# Patient Record
Sex: Female | Born: 1980 | Race: White | Hispanic: No | Marital: Married | Smoking: Former smoker
Health system: Southern US, Community
[De-identification: ages and names within clinical notes are randomized; demographics above are authoritative.]

## PROBLEM LIST (undated history)

## (undated) DIAGNOSIS — R911 Solitary pulmonary nodule: Secondary | ICD-10-CM

## (undated) DIAGNOSIS — Z87442 Personal history of urinary calculi: Secondary | ICD-10-CM

## (undated) DIAGNOSIS — K219 Gastro-esophageal reflux disease without esophagitis: Secondary | ICD-10-CM

## (undated) DIAGNOSIS — Z8249 Family history of ischemic heart disease and other diseases of the circulatory system: Secondary | ICD-10-CM

## (undated) DIAGNOSIS — E785 Hyperlipidemia, unspecified: Secondary | ICD-10-CM

## (undated) DIAGNOSIS — R079 Chest pain, unspecified: Secondary | ICD-10-CM

## (undated) HISTORY — DX: Solitary pulmonary nodule: R91.1

## (undated) HISTORY — DX: Hyperlipidemia, unspecified: E78.5

---
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
## 1999-09-24 ENCOUNTER — Other Ambulatory Visit: Admission: RE | Admit: 1999-09-24 | Discharge: 1999-09-24 | Payer: Self-pay | Admitting: Obstetrics and Gynecology

## 2002-11-30 ENCOUNTER — Encounter: Payer: Self-pay | Admitting: Family Medicine

## 2002-11-30 ENCOUNTER — Encounter: Admission: RE | Admit: 2002-11-30 | Discharge: 2002-11-30 | Payer: Self-pay | Admitting: Family Medicine

## 2005-06-03 ENCOUNTER — Other Ambulatory Visit: Admission: RE | Admit: 2005-06-03 | Discharge: 2005-06-03 | Payer: Self-pay | Admitting: Obstetrics and Gynecology

## 2007-01-12 ENCOUNTER — Emergency Department (HOSPITAL_COMMUNITY): Admission: EM | Admit: 2007-01-12 | Discharge: 2007-01-12 | Payer: Self-pay | Admitting: Emergency Medicine

## 2007-05-05 HISTORY — PX: RHINOPLASTY: SUR1284

## 2007-06-17 ENCOUNTER — Emergency Department (HOSPITAL_COMMUNITY): Admission: EM | Admit: 2007-06-17 | Discharge: 2007-06-17 | Payer: Self-pay | Admitting: Emergency Medicine

## 2007-08-16 ENCOUNTER — Ambulatory Visit (HOSPITAL_BASED_OUTPATIENT_CLINIC_OR_DEPARTMENT_OTHER): Admission: RE | Admit: 2007-08-16 | Discharge: 2007-08-16 | Payer: Self-pay | Admitting: Otolaryngology

## 2009-08-14 ENCOUNTER — Inpatient Hospital Stay (HOSPITAL_COMMUNITY): Admission: AD | Admit: 2009-08-14 | Discharge: 2009-08-14 | Payer: Self-pay | Admitting: Obstetrics and Gynecology

## 2009-10-20 ENCOUNTER — Inpatient Hospital Stay (HOSPITAL_COMMUNITY): Admission: AD | Admit: 2009-10-20 | Discharge: 2009-11-07 | Payer: Self-pay | Admitting: Obstetrics and Gynecology

## 2009-10-21 ENCOUNTER — Encounter: Payer: Self-pay | Admitting: Obstetrics & Gynecology

## 2009-10-24 ENCOUNTER — Encounter: Payer: Self-pay | Admitting: Obstetrics & Gynecology

## 2009-10-25 ENCOUNTER — Encounter: Payer: Self-pay | Admitting: Obstetrics & Gynecology

## 2009-10-28 ENCOUNTER — Encounter: Payer: Self-pay | Admitting: Obstetrics & Gynecology

## 2009-10-29 ENCOUNTER — Encounter: Payer: Self-pay | Admitting: Obstetrics & Gynecology

## 2009-10-30 ENCOUNTER — Encounter: Payer: Self-pay | Admitting: Obstetrics & Gynecology

## 2009-10-31 ENCOUNTER — Encounter: Payer: Self-pay | Admitting: Obstetrics & Gynecology

## 2009-11-01 ENCOUNTER — Encounter: Payer: Self-pay | Admitting: Obstetrics & Gynecology

## 2009-11-04 ENCOUNTER — Encounter (INDEPENDENT_AMBULATORY_CARE_PROVIDER_SITE_OTHER): Payer: Self-pay | Admitting: Obstetrics & Gynecology

## 2009-11-07 ENCOUNTER — Inpatient Hospital Stay (HOSPITAL_COMMUNITY): Admission: AD | Admit: 2009-11-07 | Discharge: 2009-11-07 | Payer: Self-pay | Admitting: Obstetrics and Gynecology

## 2009-11-07 ENCOUNTER — Encounter: Admission: RE | Admit: 2009-11-07 | Discharge: 2009-12-07 | Payer: Self-pay | Admitting: Obstetrics and Gynecology

## 2009-11-07 ENCOUNTER — Ambulatory Visit: Payer: Self-pay | Admitting: Obstetrics and Gynecology

## 2009-11-07 DIAGNOSIS — O909 Complication of the puerperium, unspecified: Secondary | ICD-10-CM

## 2009-12-08 ENCOUNTER — Encounter: Admission: RE | Admit: 2009-12-08 | Discharge: 2010-01-07 | Payer: Self-pay | Admitting: Obstetrics and Gynecology

## 2009-12-10 ENCOUNTER — Inpatient Hospital Stay (HOSPITAL_COMMUNITY): Admission: AD | Admit: 2009-12-10 | Discharge: 2009-12-10 | Payer: Self-pay | Admitting: Obstetrics and Gynecology

## 2009-12-10 ENCOUNTER — Ambulatory Visit: Payer: Self-pay | Admitting: Gynecology

## 2009-12-10 DIAGNOSIS — R0602 Shortness of breath: Secondary | ICD-10-CM

## 2009-12-10 DIAGNOSIS — R252 Cramp and spasm: Secondary | ICD-10-CM

## 2009-12-10 DIAGNOSIS — O99893 Other specified diseases and conditions complicating puerperium: Secondary | ICD-10-CM

## 2009-12-10 DIAGNOSIS — O9989 Other specified diseases and conditions complicating pregnancy, childbirth and the puerperium: Secondary | ICD-10-CM

## 2009-12-18 ENCOUNTER — Encounter: Payer: Self-pay | Admitting: Family Medicine

## 2010-01-08 ENCOUNTER — Encounter: Admission: RE | Admit: 2010-01-08 | Discharge: 2010-01-21 | Payer: Self-pay | Admitting: Obstetrics and Gynecology

## 2010-02-08 ENCOUNTER — Encounter
Admission: RE | Admit: 2010-02-08 | Discharge: 2010-03-10 | Payer: Self-pay | Source: Home / Self Care | Admitting: Obstetrics and Gynecology

## 2010-03-11 ENCOUNTER — Encounter
Admission: RE | Admit: 2010-03-11 | Discharge: 2010-04-10 | Payer: Self-pay | Source: Home / Self Care | Attending: Obstetrics and Gynecology | Admitting: Obstetrics and Gynecology

## 2010-04-11 ENCOUNTER — Encounter
Admission: RE | Admit: 2010-04-11 | Discharge: 2010-05-11 | Payer: Self-pay | Source: Home / Self Care | Attending: Obstetrics and Gynecology | Admitting: Obstetrics and Gynecology

## 2010-05-06 ENCOUNTER — Other Ambulatory Visit (HOSPITAL_COMMUNITY): Payer: Self-pay | Admitting: Obstetrics and Gynecology

## 2010-05-06 ENCOUNTER — Ambulatory Visit
Admission: RE | Admit: 2010-05-06 | Discharge: 2010-05-06 | Payer: Self-pay | Source: Home / Self Care | Attending: Obstetrics and Gynecology | Admitting: Obstetrics and Gynecology

## 2010-05-06 ENCOUNTER — Encounter: Payer: Self-pay | Admitting: Family Medicine

## 2010-05-09 ENCOUNTER — Encounter: Payer: Self-pay | Admitting: Family Medicine

## 2010-05-09 ENCOUNTER — Ambulatory Visit (HOSPITAL_COMMUNITY)
Admission: RE | Admit: 2010-05-09 | Discharge: 2010-05-09 | Payer: Self-pay | Source: Home / Self Care | Attending: Obstetrics and Gynecology | Admitting: Obstetrics and Gynecology

## 2010-05-12 ENCOUNTER — Encounter
Admission: RE | Admit: 2010-05-12 | Discharge: 2010-05-20 | Payer: Self-pay | Source: Home / Self Care | Attending: Obstetrics and Gynecology | Admitting: Obstetrics and Gynecology

## 2010-05-16 ENCOUNTER — Other Ambulatory Visit: Payer: Self-pay | Admitting: Family Medicine

## 2010-05-16 ENCOUNTER — Encounter: Payer: Self-pay | Admitting: Family Medicine

## 2010-05-16 ENCOUNTER — Observation Stay (HOSPITAL_COMMUNITY)
Admission: EM | Admit: 2010-05-16 | Discharge: 2010-05-17 | Payer: Self-pay | Source: Home / Self Care | Attending: Internal Medicine | Admitting: Internal Medicine

## 2010-05-16 ENCOUNTER — Ambulatory Visit
Admission: RE | Admit: 2010-05-16 | Discharge: 2010-05-16 | Payer: Self-pay | Source: Home / Self Care | Attending: Family Medicine | Admitting: Family Medicine

## 2010-05-16 LAB — CBC WITH DIFFERENTIAL/PLATELET
Basophils Absolute: 0.1 10*3/uL (ref 0.0–0.1)
Basophils Relative: 0.7 % (ref 0.0–3.0)
Eosinophils Absolute: 0.3 10*3/uL (ref 0.0–0.7)
Eosinophils Relative: 3.6 % (ref 0.0–5.0)
HCT: 45.1 % (ref 36.0–46.0)
Hemoglobin: 15.6 g/dL — ABNORMAL HIGH (ref 12.0–15.0)
Lymphocytes Relative: 27.9 % (ref 12.0–46.0)
Lymphs Abs: 2.2 10*3/uL (ref 0.7–4.0)
MCHC: 34.6 g/dL (ref 30.0–36.0)
MCV: 97.6 fl (ref 78.0–100.0)
Monocytes Absolute: 0.4 10*3/uL (ref 0.1–1.0)
Monocytes Relative: 5.1 % (ref 3.0–12.0)
Neutro Abs: 4.9 10*3/uL (ref 1.4–7.7)
Neutrophils Relative %: 62.7 % (ref 43.0–77.0)
Platelets: 296 10*3/uL (ref 150.0–400.0)
RBC: 4.62 Mil/uL (ref 3.87–5.11)
RDW: 12.3 % (ref 11.5–14.6)
WBC: 7.9 10*3/uL (ref 4.5–10.5)

## 2010-05-16 LAB — HEPATIC FUNCTION PANEL
ALT: 12 U/L (ref 0–35)
AST: 14 U/L (ref 0–37)
Albumin: 4.6 g/dL (ref 3.5–5.2)
Alkaline Phosphatase: 55 U/L (ref 39–117)
Bilirubin, Direct: 0.2 mg/dL (ref 0.0–0.3)
Total Bilirubin: 1.8 mg/dL — ABNORMAL HIGH (ref 0.3–1.2)
Total Protein: 7.3 g/dL (ref 6.0–8.3)

## 2010-05-16 LAB — TSH: TSH: 1.57 u[IU]/mL (ref 0.35–5.50)

## 2010-05-16 LAB — BASIC METABOLIC PANEL
BUN: 14 mg/dL (ref 6–23)
CO2: 25 mEq/L (ref 19–32)
Calcium: 9.6 mg/dL (ref 8.4–10.5)
Chloride: 104 mEq/L (ref 96–112)
Creatinine, Ser: 0.8 mg/dL (ref 0.4–1.2)
GFR: 92.04 mL/min (ref >60.00)
Glucose, Bld: 87 mg/dL (ref 70–99)
Potassium: 4.5 mEq/L (ref 3.5–5.1)
Sodium: 138 mEq/L (ref 135–145)

## 2010-05-16 LAB — CARDIAC PANEL
CK-MB: 1.1 ng/mL (ref 0.3–4.0)
Relative Index: 1.5 calc (ref 0.0–2.5)
Total CK: 74 U/L (ref 7–177)

## 2010-05-17 ENCOUNTER — Encounter (INDEPENDENT_AMBULATORY_CARE_PROVIDER_SITE_OTHER): Payer: Self-pay | Admitting: Internal Medicine

## 2010-05-17 ENCOUNTER — Encounter: Payer: Self-pay | Admitting: Family Medicine

## 2010-05-19 LAB — CBC
HCT: 42.5 % (ref 36.0–46.0)
Hemoglobin: 14.2 g/dL (ref 12.0–15.0)
MCH: 32 pg (ref 26.0–34.0)
MCHC: 33.4 g/dL (ref 30.0–36.0)
MCV: 95.7 fL (ref 78.0–100.0)
Platelets: 282 10*3/uL (ref 150–400)
RBC: 4.44 MIL/uL (ref 3.87–5.11)
RDW: 12 % (ref 11.5–15.5)
WBC: 9.4 10*3/uL (ref 4.0–10.5)

## 2010-05-19 LAB — LIPID PANEL
Cholesterol: 210 mg/dL — ABNORMAL HIGH (ref 0–200)
HDL: 58 mg/dL (ref >39)
LDL Cholesterol: 142 mg/dL — ABNORMAL HIGH (ref 0–99)
Total CHOL/HDL Ratio: 3.6 RATIO
Triglycerides: 50 mg/dL (ref <150)
VLDL: 10 mg/dL (ref 0–40)

## 2010-05-19 LAB — CARDIAC PANEL(CRET KIN+CKTOT+MB+TROPI)
CK, MB: 0.8 ng/mL (ref 0.3–4.0)
CK, MB: 0.8 ng/mL (ref 0.3–4.0)
Relative Index: INVALID (ref 0.0–2.5)
Relative Index: INVALID (ref 0.0–2.5)
Total CK: 50 U/L (ref 7–177)
Total CK: 43 U/L (ref 7–177)
Troponin I: <0.01 ng/mL (ref 0.00–0.06)
Troponin I: <0.01 ng/mL (ref 0.00–0.06)

## 2010-05-19 LAB — POCT CARDIAC MARKERS
CKMB, poc: <1 ng/mL — ABNORMAL LOW (ref 1.0–8.0)
Myoglobin, poc: 32.3 ng/mL (ref 12–200)
Troponin i, poc: <0.05 ng/mL (ref 0.00–0.09)

## 2010-05-19 LAB — BASIC METABOLIC PANEL
BUN: 13 mg/dL (ref 6–23)
CO2: 22 mEq/L (ref 19–32)
Calcium: 8.9 mg/dL (ref 8.4–10.5)
Chloride: 103 mEq/L (ref 96–112)
Creatinine, Ser: 0.91 mg/dL (ref 0.4–1.2)
GFR calc Af Amer: >60 mL/min (ref >60)
GFR calc non Af Amer: >60 mL/min (ref >60)
Glucose, Bld: 93 mg/dL (ref 70–99)
Potassium: 3.6 mEq/L (ref 3.5–5.1)
Sodium: 133 mEq/L — ABNORMAL LOW (ref 135–145)

## 2010-05-19 LAB — DIFFERENTIAL
Basophils Absolute: 0.1 10*3/uL (ref 0.0–0.1)
Basophils Relative: 1 % (ref 0–1)
Eosinophils Absolute: 0.5 10*3/uL (ref 0.0–0.7)
Eosinophils Relative: 5 % (ref 0–5)
Lymphocytes Relative: 31 % (ref 12–46)
Lymphs Abs: 2.9 10*3/uL (ref 0.7–4.0)
Monocytes Absolute: 0.6 10*3/uL (ref 0.1–1.0)
Monocytes Relative: 6 % (ref 3–12)
Neutro Abs: 5.4 10*3/uL (ref 1.7–7.7)
Neutrophils Relative %: 57 % (ref 43–77)

## 2010-05-19 LAB — POCT PREGNANCY, URINE: Preg Test, Ur: NEGATIVE

## 2010-05-19 LAB — TSH: TSH: 2.948 u[IU]/mL (ref 0.350–4.500)

## 2010-05-19 LAB — CK TOTAL AND CKMB (NOT AT ARMC)
CK, MB: 0.9 ng/mL (ref 0.3–4.0)
Relative Index: INVALID (ref 0.0–2.5)
Total CK: 59 U/L (ref 7–177)

## 2010-05-19 LAB — TROPONIN I: Troponin I: 0.01 ng/mL (ref 0.00–0.06)

## 2010-05-19 LAB — D-DIMER, QUANTITATIVE: D-Dimer, Quant: 0.37 ug/mL-FEU (ref 0.00–0.48)

## 2010-05-21 ENCOUNTER — Encounter: Payer: Self-pay | Admitting: Cardiovascular Disease

## 2010-05-21 ENCOUNTER — Ambulatory Visit
Admission: RE | Admit: 2010-05-21 | Discharge: 2010-05-21 | Payer: Self-pay | Source: Home / Self Care | Attending: Cardiovascular Disease | Admitting: Cardiovascular Disease

## 2010-05-22 ENCOUNTER — Ambulatory Visit
Admission: RE | Admit: 2010-05-22 | Discharge: 2010-05-22 | Payer: Self-pay | Source: Home / Self Care | Attending: Cardiovascular Disease | Admitting: Cardiovascular Disease

## 2010-05-22 ENCOUNTER — Ambulatory Visit: Admission: RE | Admit: 2010-05-22 | Discharge: 2010-05-22 | Payer: Self-pay | Source: Home / Self Care

## 2010-05-22 ENCOUNTER — Encounter: Payer: Self-pay | Admitting: Cardiovascular Disease

## 2010-05-25 ENCOUNTER — Encounter: Payer: Self-pay | Admitting: Obstetrics & Gynecology

## 2010-05-26 NOTE — H&P (Signed)
Carrie Morrison, ROAN              ACCOUNT NO.:  [id number]  MEDICAL RECORD NO.:  [id number]          PATIENT TYPE:  EMS  LOCATION:  [facility]                         FACILITY:  [facility]  PHYSICIAN:  Houston Siren, MD           DATE OF BIRTH:  18-May-1980  DATE OF ADMISSION:  05/16/2010 DATE OF DISCHARGE:                             HISTORY & PHYSICAL   PRIMARY CARE PHYSICIAN:  Lelon Perla, DO  ADVANCE DIRECTIVES:  Full code.  REASON FOR ADMISSION:  Chest pain.  HISTORY OF PRESENT ILLNESS:  This is a 30 year old female, previously healthy, on no chronic medication, presents to the emergency room with intermittent substernal chest pain  with left arm radiation and some nausea and diaphoresis for the past couple of days.  She usually had no exertional chest pain and has been healthy, and prior to the birth of her first child, she was exercising regularly.  She never had any exertional chest pain, but noted that she has some dyspnea on exertion.  There is no audible wheezing or cough with exercise.  She denied any abdominal cramps or pain, black stool, or bloody stool.  She has no fever, chills, or cough.  She has had no calf swelling or calf pain.  She denied long car or plane ride.  In the emergency room, evaluation showed negative cardiac enzymes, negative EKG, and negative D-dimer.  She had a CT scan which showed a small right lower lobe nodule, but the current chest x-ray did not show this 5-mm nodule seen on previous CT.  She does have strong family history of coronary artery disease which includes her father and mother both had coronary artery disease in their 43s and 82s.  She was a prior smoker, but currently she is not, and she does not know her lipid profile.  PAST MEDICAL HISTORY:  As above.  REVIEW OF SYSTEMS:  Otherwise unremarkable.  FAMILY HISTORY:  Both parents have premature coronary artery disease.  SOCIAL HISTORY:  She was a formal smoker but denied any  current cigarette use nor drug use.  She does have a baby.  She works in Audiological scientist.  Her husband works in Airline pilot.  ALLERGIES:  LATEX which causes a rash.  MEDICATIONS:  She took an aspirin today but otherwise on no chronic medications.  PHYSICAL EXAMINATION:  VITAL SIGNS:  Blood pressure 100/65, pulse 77, respiratory rate 18, and temperature 97.9. GENERAL:  She is alert and oriented and is in no apparent distress. NECK:  She has no carotid bruit. HEENT:  Throat is clear.  Speech is fluent.  She has facial symmetry. CARDIAC:  S1-S2 regular.  I did not hear any murmur or any click. LUNGS:  Clear with no wheezes, rales, or any evidence of consolidation. ABDOMEN:  Soft, nondistended, and nontender.  No right upper quadrant pain. CHEST:  Palpation of her chest wall reveals slight tenderness but not significant. EXTREMITIES:  No edema.  Good pulses bilaterally. SKIN:  Warm and dry.  She has no calf tenderness.  OBJECTIVE FINDINGS:  Chest x-ray clear.  D-dimer of 0.37.  Troponin less than 0.05.  Potassium 3.6 and creatinine 0.91.  White count of 9.4 thousand, and hemoglobin of 14.2.  EKG shows normal sinus rhythm without any acute ST-T changes, normal interval.  Axis is at 60 degrees.  IMPRESSION:  This is a 30 year old female with low cardiac risk factors presented with atypical chest pain.  She does have strong family history of coronary artery disease, and she does not know her lipid profile.  I do not think she has acute coronary syndrome, but we will admit her to Telemetry for rule out with serial CPKs and troponins.  She should proceed with stress test and echo, although I suspect this will be a low yield.  I would like to check her lipid profile, continue aspirin, and add 2 grams of fish oil to her regimen. She is 6 months postpartum, and I will check a TSH as well.  I did speak with her and her husband about acute coronary syndrome and risk modifications.  She is stable, full  code, and will be admitted to the [facility].     Houston Siren, MD     PL/MEDQ  D:  05/17/2010  T:  05/17/2010  Job:  [id number]  Electronically Signed by Houston Siren  on 05/26/2010 09:10:56 PM

## 2010-05-30 ENCOUNTER — Encounter: Payer: Self-pay | Admitting: Family Medicine

## 2010-05-30 ENCOUNTER — Ambulatory Visit
Admission: RE | Admit: 2010-05-30 | Discharge: 2010-05-30 | Payer: Self-pay | Source: Home / Self Care | Attending: Family Medicine | Admitting: Family Medicine

## 2010-05-30 ENCOUNTER — Ambulatory Visit
Admission: RE | Admit: 2010-05-30 | Discharge: 2010-05-30 | Payer: Self-pay | Source: Home / Self Care | Attending: Internal Medicine | Admitting: Internal Medicine

## 2010-05-30 ENCOUNTER — Other Ambulatory Visit: Payer: Self-pay | Admitting: Internal Medicine

## 2010-05-30 DIAGNOSIS — R911 Solitary pulmonary nodule: Secondary | ICD-10-CM

## 2010-05-30 LAB — CONVERTED CEMR LAB
Glucose, Urine, Semiquant: NEGATIVE
Nitrite: NEGATIVE
WBC Urine, dipstick: NEGATIVE
pH: 5

## 2010-05-31 ENCOUNTER — Encounter: Payer: Self-pay | Admitting: Family Medicine

## 2010-06-03 ENCOUNTER — Telehealth: Payer: Self-pay | Admitting: Internal Medicine

## 2010-06-03 LAB — CONVERTED CEMR LAB
Bacteria, UA: NONE SEEN
Casts: NONE SEEN /lpf
Crystals: NONE SEEN
Ketones, ur: NEGATIVE mg/dL
Leukocytes, UA: NEGATIVE
Nitrite: NEGATIVE
Specific Gravity, Urine: 1.024 (ref 1.005–1.030)
Squamous Epithelial / LPF: NONE SEEN /lpf
Urobilinogen, UA: 0.2 (ref 0.0–1.0)
pH: 5 (ref 5.0–8.0)

## 2010-06-05 ENCOUNTER — Encounter (HOSPITAL_COMMUNITY): Payer: Self-pay

## 2010-06-05 NOTE — Letter (Signed)
Summary: Encounter Notice/[facility]  Encounter Notice/[facility]   Imported By: Lanelle Bal 05/30/2010 10:28:24  _____________________________________________________________________  External Attachment:    Type:   Image     Comment:   External Document

## 2010-06-05 NOTE — Assessment & Plan Note (Signed)
Summary: new pt--chest pain, SOB, pulm nodule on ct scan--appt set up by   Vital Signs:  Patient profile:   30 year old female Menstrual status:  regular LMP:     04/17/2010 Height:      64 inches Weight:      147.2 pounds BMI:     25.36 Pulse rate:   68 / minute Pulse rhythm:   regular BP sitting:   110 / 62  (right arm) Cuff size:   regular  Vitals Entered By: Almeta Monas CMA Duncan Dull) (May 16, 2010 10:32 AM) CC: New Est--wants to discuss CT from [facility] LMP (date): 04/17/2010     Menstrual Status regular Enter LMP: 04/17/2010   History of Present Illness: Pt is here to establish.  Pt recently had baby in July and was having chest pain.  OB/Gyn sent her for dopplers and CT chest.   She was told she had a nodule on her lung.  She used to smoke---quit smoking January 2011.   Pt was smoking  about 1 pack a day for at least 10 years.  Pt is having shooting pains in L chest that go down L arm.  She also has pain across R low ribs.   Pt is getting chest pain daily.  yesterday she felt it while walking.  When she rested it went.    Preventive Screening-Counseling & Management  Alcohol-Tobacco     Alcohol drinks/day: <1     Alcohol type: wine     Smoking Status: quit     Smoke Cessation Stage: quit     Packs/Day: 1.0     Year Started: 2002     Year Quit: 2011     Passive Smoke Exposure: yes  Caffeine-Diet-Exercise     Caffeine use/day: 1     Does Patient Exercise: yes     Type of exercise: walking     Times/week: 7      Sexual History:  currently monogamous.        Drug Use:  no.    Current Medications (verified): 1)  None  Allergies (verified): 1)  ! Latex Exam Gloves (Disposable Gloves)  Past History:  Family History: Last updated: 05/16/2010 Family History High cholesterol Family History Hypertension Family History of Sudden Death Family History of Cardiovascular disorder Family History of Colon CA 1st degree relative <60 Family History Diabetes 1st  degree relative Family History of Stroke F 1st degree relative <60 Family History of CAD Female 1st degree relative <50 -- 31yo Family History of CAD Female 1st degree relative <60    Social History: Last updated: 05/16/2010 Married Former Smoker Alcohol use-yes Drug use-no Regular exercise-yes  Risk Factors: Alcohol Use: <1 (05/16/2010) Caffeine Use: 1 (05/16/2010) Exercise: yes (05/16/2010)  Risk Factors: Smoking Status: quit (05/16/2010) Packs/Day: 1.0 (05/16/2010) Passive Smoke Exposure: yes (05/16/2010)  Past Medical History: Kidney stones  Migraines UTI Headache  Past Surgical History: Caesarean Section 11/04/09 Rhinoplasty 2009  Family History: Reviewed history and no changes required. Family History High cholesterol Family History Hypertension Family History of Sudden Death Family History of Cardiovascular disorder Family History of Colon CA 1st degree relative <60 Family History Diabetes 1st degree relative Family History of Stroke F 1st degree relative <60 Family History of CAD Female 1st degree relative <50 -- 31yo Family History of CAD Female 1st degree relative <60    Social History: Reviewed history and no changes required. Married Former Smoker Alcohol use-yes Drug use-no Regular exercise-yes Smoking Status:  quit Drug  Use:  no Does Patient Exercise:  yes Packs/Day:  1.0 Passive Smoke Exposure:  yes Caffeine use/day:  1 Sexual History:  currently monogamous                       Review of Systems      See HPI  Physical Exam  General:  Well-developed,well-nourished,in no acute distress; alert,appropriate and cooperative throughout examination Neck:  No deformities, masses, or tenderness noted. Lungs:  Normal respiratory effort, chest expands symmetrically. Lungs are clear to auscultation, no crackles or wheezes. Heart:  normal rate and no murmur.   Extremities:  No clubbing, cyanosis, edema, or deformity noted with normal full range of  motion of all joints.   Skin:  Intact without suspicious lesions or rashes Psych:  Oriented X3, normally interactive, good eye contact, not anxious appearing, and not depressed appearing.      Impression & Recommendations:  Problem # 1:  CHEST PAIN UNSPECIFIED (ICD-786.50) Refer to cardiology secondary to family history and ongoing symptoms CT neg PE but there was a pulm nodule  Orders: Cardiology Referral (Cardiology) Venipuncture [phone]) TLB-Cardiac Panel (56213_08657-QION) TLB-BMP (Basic Metabolic Panel-BMET) (80048-METABOL) TLB-CBC Platelet - w/Differential (85025-CBCD) TLB-Hepatic/Liver Function Pnl (80076-HEPATIC) TLB-TSH (Thyroid Stimulating Hormone) (84443-TSH) T- * Misc. Laboratory test ([phone]) Specimen Handling (84132) EKG w/ Interpretation (93000)  Problem # 2:  PULMONARY NODULE, RIGHT LOWER LOBE (ICD-518.89)  Orders: Pulmonary Referral (Pulmonary) EKG w/ Interpretation (93000)   Orders Added: 1)  Cardiology Referral [Cardiology] 2)  Venipuncture [44010] 3)  TLB-Cardiac Panel [82550_82553-CARD] 4)  TLB-BMP (Basic Metabolic Panel-BMET) [80048-METABOL] 5)  TLB-CBC Platelet - w/Differential [85025-CBCD] 6)  TLB-Hepatic/Liver Function Pnl [80076-HEPATIC] 7)  TLB-TSH (Thyroid Stimulating Hormone) [84443-TSH] 8)  T- * Misc. Laboratory test [phone] 9)  Specimen Handling [99000] 10)  Pulmonary Referral [Pulmonary] 11)  New Patient Level III [99203] 12)  EKG w/ Interpretation [93000]   Immunization History:  Tetanus/Td Immunization History:    Tetanus/Td:  historical (07/02/2008)   Immunization History:  Tetanus/Td Immunization History:    Tetanus/Td:  Historical (07/02/2008)

## 2010-06-05 NOTE — Assessment & Plan Note (Signed)
Summary: RASH MOSTLY ALL OVER, SOME AREAS ON LEGS///SPH   Vital Signs:  Patient profile:   30 year old female Menstrual status:  regular Weight:      145.0 pounds Temp:     98.2 degrees F oral BP sitting:   108 / 72  (right arm) Cuff size:   large  Vitals Entered By: Almeta Monas CMA (AAMA) (May 30, 2010 10:00 AM) CC: c/o rash on the arms and legs that itch xfew months, Rash, Dysuria   History of Present Illness:  Rash      This is a 30 year old woman who presents with Rash.  The patient complains of papules, but denies macules, nodules, hives, welts, pustules, blisters, ulcers, itching, scaling, weeping, oozing, redness, increased warmth, and tenderness.  The patient denies the following symptoms: fever, headache, facial swelling, tongue swelling, burning, difficulty breathing, abdominal pain, nausea, vomiting, diarrhea, dizziness, sore throat, dysuria, eye symptoms, arthralgias, and vaginal discharge.  The patient denies history of recent tick bite, recent tick exposure, other insect bite, recent infection, recent antibiotic use, new medication, new clothing, new topical exposure, recent travel, pet/animal contact, thyroid disease, chronic liver disease, autoimmune disease, chronic edema, and prior STD.    Dysuria      The patient also presents with Dysuria.  The patient complains of burning with urination and urinary frequency, but denies urgency, hematuria, vaginal discharge, vaginal itching, and vaginal sores.  The patient denies the following associated symptoms: nausea, vomiting, fever, shaking chills, flank pain, abdominal pain, back pain, pelvic pain, and arthralgias.  The patient denies the following risk factors: diabetes, prior antibiotics, immunosuppression, history of GU anomaly, history of pyelonephritis, pregnancy, history of STD, and analgesic abuse.    Current Medications (verified): 1)  Aspirin 81 Mg Tbec (Aspirin) .... Take One Tablet By Mouth Daily 2)  Westcort  0.2 % Oint (Hydrocortisone Valerate) .... Apply Three Times A Day As Needed 3)  Macrobid 100 Mg Caps (Nitrofurantoin Monohyd Macro) .Marland Kitchen.. 1 By Mouth Two Times A Day  Allergies (verified): 1)  ! Latex Exam Gloves (Disposable Gloves)  Past History:  Past Medical History: Last updated: 05/21/2010 Kidney stones  Migraines Headaches UTI Hyperlipidemia Uterine fibroids  Past Surgical History: Last updated: 05/21/2010 Caesarean Section 11/04/09 Rhinoplasty 2009 Oral surgery 1995  Family History: Last updated: 05/21/2010 Family History High cholesterol Family History Hypertension Family History of Sudden Death Family History of Cardiovascular disorder Family History of Colon CA 1st degree relative <60 Family History Diabetes 1st degree relative Family History of Stroke F 1st degree relative <60 Family History of CAD Female 1st degree relative <50 -- 31yo Family History of CAD Female 1st degree relative <60    Mother alive age 40, CAD with MI at age 15 Father deceased at age 68, CAD starting at age 35, died age 16. Paternal Grandfather with MI at age 22.   Social History: Last updated: 05/21/2010 Married, 1 child Former Smoker-Smoked for 10 years. Quit November 2009 Alcohol use-yes Drug use-no Regular exercise-yes Stays at home with her baby  Risk Factors: Alcohol Use: <1 (05/16/2010) Caffeine Use: 1 (05/16/2010) Exercise: yes (05/16/2010)  Risk Factors: Smoking Status: quit (05/22/2010) Packs/Day: 1.0 (05/16/2010) Passive Smoke Exposure: yes (05/16/2010)  Family History: Reviewed history from 05/21/2010 and no changes required. Family History High cholesterol Family History Hypertension Family History of Sudden Death Family History of Cardiovascular disorder Family History of Colon CA 1st degree relative <60 Family History Diabetes 1st degree relative Family History of Stroke F 1st  degree relative <60 Family History of CAD Female 1st degree relative <50 --  31yo Family History of CAD Female 1st degree relative <60    Mother alive age 70, CAD with MI at age 68 Father deceased at age 44, CAD starting at age 59, died age 37. Paternal Grandfather with MI at age 77.   Social History: Reviewed history from 05/21/2010 and no changes required. Married, 1 child Former Smoker-Smoked for 10 years. Quit November 2009 Alcohol use-yes Drug use-no Regular exercise-yes Stays at home with her baby  Review of Systems      See HPI  Physical Exam  General:  Well-developed,well-nourished,in no acute distress; alert,appropriate and cooperative throughout examination Skin:  papules arms and legs  Psych:  Oriented X3 and normally interactive.     Impression & Recommendations:  Problem # 1:  RASH-NONVESICULAR (ICD-782.1)  Her updated medication list for this problem includes:    Westcort 0.2 % Oint (Hydrocortisone valerate) .Marland Kitchen... Apply three times a day as needed  Discussed medication use and symptom control.   Problem # 2:  DYSURIA (ICD-788.1)  Her updated medication list for this problem includes:    Macrobid 100 Mg Caps (Nitrofurantoin monohyd macro) .Marland Kitchen... 1 by mouth two times a day  Orders: T-Urinalysis (54098-11914) T-Culture, Urine (78295-62130) T-Urine Microscopic (86578-46962)  Encouraged to push clear liquids, get enough rest, and take acetaminophen as needed. To be seen in 10 days if no improvement, sooner if worse.  Complete Medication List: 1)  Aspirin 81 Mg Tbec (Aspirin) .... Take one tablet by mouth daily 2)  Westcort 0.2 % Oint (Hydrocortisone valerate) .... Apply three times a day as needed 3)  Macrobid 100 Mg Caps (Nitrofurantoin monohyd macro) .Marland Kitchen.. 1 by mouth two times a day Prescriptions: MACROBID 100 MG CAPS (NITROFURANTOIN MONOHYD MACRO) 1 by mouth two times a day  #14 x 0   Entered and Authorized by:   Loreen Freud DO   Signed by:   Loreen Freud DO on 05/30/2010   Method used:   Electronically to        [facility] [phone]* (retail)       [address]       [place], [place]  [address]       Ph: [phone] or [phone]       Fax: [phone]   RxID:   [id number] WESTCORT 0.2 % OINT (HYDROCORTISONE VALERATE) apply three times a day as needed  #30g x 2   Entered and Authorized by:   Loreen Freud DO   Signed by:   Loreen Freud DO on 05/30/2010   Method used:   Electronically to        [facility] [phone]* (retail)       [address]       [place], [place]  [address]       Ph: [phone] or [phone]       Fax: [phone]   RxID:   [id number]    Orders Added: 1)  T-Urinalysis [81003-65000] 2)  T-Culture, Urine [50093-81829] 3)  T-Urine Microscopic [93716-96789] 4)  Est. Patient Level III [38101]    Laboratory Results   Urine Tests    Routine Urinalysis   Color: yellow Appearance: Hazy Glucose: negative   (Normal Range: Negative) Bilirubin: negative   (Normal Range: Negative) Ketone: negative   (Normal Range: Negative) Spec. Gravity: 1.015   (Normal Range: 1.003-1.035) Blood: negative   (Normal Range: Negative) pH: 5.0   (Normal Range: 5.0-8.0) Protein:  negative   (Normal Range: Negative) Urobilinogen: 0.2   (Normal Range: 0-1) Nitrite: negative   (Normal Range: Negative) Leukocyte Esterace: negative   (Normal Range: Negative)

## 2010-06-05 NOTE — Letter (Signed)
Summary: [facility] [place]   Imported By: Lanelle Bal 05/21/2010 14:24:25  _____________________________________________________________________  External Attachment:    Type:   Image     Comment:   External Document

## 2010-06-05 NOTE — Letter (Signed)
Summary: [facility]   Imported By: Lanelle Bal 05/21/2010 14:23:01  _____________________________________________________________________  External Attachment:    Type:   Image     Comment:   External Document

## 2010-06-05 NOTE — Assessment & Plan Note (Signed)
Summary: CONSULT RIGHT LOWER LOBE NODULE//SH   Visit Type:  Initial Consult Copy to:  Dr. Loreen Freud Primary Provider/Referring Provider:  Dr. Loreen Freud  CC:  Pulmonary COnsult for Abnormal CXR and CT. Marland Kitchen  History of Present Illness: 30 yo WF with strong family history of CAD and personal history of hyperlipidemia (TC 210, LDL 142s and  HDL 58 and TC/HDL 3.6). Ex-smoker 10 pack smoker. Quit Nov 2010 just before pregnancy.  She then  had first child in July 2011 (10 weeks premature) and immediately after that noticed "pitting pedal edema" and few weeks later  noticed chest burning, heaviness, dyspnea, shooting/burning and central chest pain, pain in left calf with some pain in arm and back. She ignored these symptoms but these persisted. then on May 06, 2010 had normal  venous dopplers ruling out DVt. On 05/09/2010 had opd CT chest that ruled out PE but showed 6mm non calcified RLL nodule (has .   She then presented to ER with worsening symptoms on 05/16/2010 where she hHad normal d-dimer, troponin and ekg and was discharged on 05/17/2010 after normal ECHO and cardiac enzumes. Then saw Dr. Clifton James cards. Had normal repeat dopplerlegs on 05/22/2010 and normal stres EKG on 05/22/2010.She is now on nexium but she believes she is not having GERD. She is yet to start medicines.   She is here today for eval of nodule. She still feels all her symptoms of blurred vision, dizziness, random dyspnea, atypical chest pain (deep in chest, persistent on both sides, each side pain is different, random occurences, no specific aggravating or relieving factors). DEnies associated wheezing, cough, syncope, diplopia  or hemoptysis.   Preventive Screening-Counseling & Management  Alcohol-Tobacco     Alcohol drinks/day: <1     Alcohol type: wine     Smoking Status: quit     Smoke Cessation Stage: quit     Packs/Day: 1.0     Year Started: 2002     Year Quit: 2011     Passive Smoke Exposure: yes  Current Medications  (verified): 1)  Aspirin 81 Mg Tbec (Aspirin) .... Take One Tablet By Mouth Daily 2)  Westcort 0.2 % Oint (Hydrocortisone Valerate) .... Apply Three Times A Day As Needed 3)  Macrobid 100 Mg Caps (Nitrofurantoin Monohyd Macro) .Marland Kitchen.. 1 By Mouth Two Times A Day  Allergies (verified): 1)  ! Latex Exam Gloves (Disposable Gloves)  Family History: Family History High cholesterol Family History Hypertension Family History of Sudden Death Family History of Cardiovascular disorder Family History of Colon CA 1st degree relative <60 Family History Diabetes 1st degree relative Family History of Stroke F 1st degree relative <60 Family History of CAD Female 1st degree relative <50 -- 30yo Family History of CAD Female 1st degree relative <60    Mother alive age 30, CAD with MI at age 17 Father deceased at age 3, CAD starting at age 1, died age 86. Paternal Grandfather with MI at age 74.  Brother - exercise induced asthma  Social History: Married, 1 child.Not breast feeding Former Smoker-Smoked for 10 years. 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  The patient denies shortness of breath with activity, productive cough, non-productive cough, coughing up blood, irregular heartbeats, acid heartburn, indigestion, loss of appetite, weight change, abdominal pain, difficulty swallowing, sore throat, tooth/dental problems, headaches, nasal congestion/difficulty breathing through nose, sneezing, itching, ear ache, anxiety, depression, hand/feet swelling, joint stiffness or pain, rash, change in color of mucus, and fever.         dizziness, blurred vision   Vital  Signs:  Patient profile:   30 year old female Menstrual status:  regular Height:      64 inches Weight:      145.38 pounds BMI:     25.04 O2 Sat:      97 % on Room air Temp:     98.2 degrees F oral Pulse rate:   70 / minute BP sitting:   110 / 72  (right arm) Cuff size:   regular  Vitals Entered By: Carron Curie CMA (May 30, 2010 3:46 PM)  O2 Flow:  Room air CC: Pulmonary COnsult for Abnormal CXR and CT.  Comments Medications reviewed with patient Carron Curie CMA  May 30, 2010 3:46 PM Daytime phone number verified with patient.    Physical Exam  General:  well developed, well nourished, in no acute distress Head:  normocephalic and atraumatic Eyes:  PERRLA/EOM intact; conjunctiva and sclera clear Ears:  TMs intact and clear with normal canals Nose:  no deformity, discharge, inflammation, or lesions Mouth:  no deformity or lesions Neck:  no masses, thyromegaly, or abnormal cervical nodes Chest Wall:  no deformities noted Lungs:  clear bilaterally to auscultation and percussion Heart:  regular rate and rhythm, S1, S2 without murmurs, rubs, gallops, or clicks Abdomen:  bowel sounds positive; abdomen soft and non-tender without masses, or organomegaly Msk:  no deformity or scoliosis noted with normal posture Pulses:  pulses normal Extremities:  no clubbing, cyanosis, edema, or deformity noted Neurologic:  CN II-XII grossly intact with normal reflexes, coordination, muscle strength and tone Skin:  intact without lesions or rashes Cervical Nodes:  no significant adenopathy Axillary Nodes:  no significant adenopathy Psych:  alert and cooperative; normal mood and affect; normal attention span and concentration   CT of Chest  Procedure date:  05/09/2010  Findings:      5.7 mm RLL non-calcified granuloma in image 28  Comments:      independently reviwed  Impression & Recommendations:  Problem # 1:  PULMONARY NODULE, RIGHT LOWER LOBE  (ICD-518.89) Assessment Unchanged NEW TO ME  Probably granuloma from travel to [place] and [place]. Young age, limited smoking hx and small size of nodule make it low pretest prob for cancer. I have explained this to her and she feels reassured. Wil repeat CT chest in 1 year and if unchanged get another one in Jan 2014 to complete 2 year followup Orders: Radiology Referral (Radiology) Consultation Level V [phone])  Problem # 2:  DYSPNEA (ICD-786.05) Assessment: New ? asthma  plan methacholine challenge test ( i explained to her low risk and safety of this study esp becuase she is not pregnant per hx and not breast feeding) if above negagtive, wil do CPST Orders: Pulmonary Referral (Pulmonary) Consultation Level V (60454)  Patient Instructions: 1)  please hvae methacholine challenge test 2)   call us to state test completed 3)  once test completed I will review and call you wiht next step 4)  return in 1 year with CT chest

## 2010-06-05 NOTE — Assessment & Plan Note (Signed)
Summary: new eval   Visit Type:  Initial Consult Primary Provider:  Laury Axon   CC:  Chest pain and some dizziness.  History of Present Illness: 30 yo WF with strong family history of CAD and personal history of hyperlipidemia who is here today to establish cardiac care. Since she had her baby in July 2010, she has noticed burning in her chest, associated with SOB, dizziness and diaphoresis, lasts for a few minutes. She had a negative CT PE study on 05/09/10 due to mildly elevated d-dimer. She was admitted to [facility] on 05/16/10 with c/o CP, SOB and dizziness. Cardiac enzymes were negative. Her echo showed normal LV size and function. She also describes left leg pain. No swelling. Normal venous dopplers at [facility] on 05/06/10 with no evidence of DVT or Bakers cyst.   Current Medications (verified): 1)  Aspirin 81 Mg Tbec (Aspirin) .... Take One Tablet By Mouth Daily  Allergies: 1)  ! Latex Exam Gloves (Disposable Gloves)  Past History:  Past Medical History: Kidney stones  Migraines Headaches UTI Hyperlipidemia Uterine fibroids  Past Surgical History: Caesarean Section 11/04/09 Rhinoplasty 2009 Oral surgery 1995  Family History: Family History High cholesterol Family History Hypertension Family History of Sudden Death Family History of Cardiovascular disorder Family History of Colon CA 1st degree relative <60 Family History Diabetes 1st degree relative Family History of Stroke F 1st degree relative <60 Family History of CAD Female 1st degree relative <50 -- 30yo Family History of CAD Female 1st degree relative <60    Mother alive age 53, CAD with MI at age 52 Father deceased at age 7, CAD starting at age 62, died age 110. Paternal Grandfather with MI at age 91.   Social History: Married, 1 child Former Smoker-Smoked for 10 years. Quit November 2009 Alcohol use-yes Drug use-no Regular exercise-yes Stays at home with her baby  Review of Systems   The patient complains of chest pain and shortness of breath.  The patient denies fatigue, malaise, fever, weight gain/loss, vision loss, decreased hearing, hoarseness, palpitations, prolonged cough, wheezing, sleep apnea, coughing up blood, abdominal pain, blood in stool, nausea, vomiting, diarrhea, heartburn, incontinence, blood in urine, muscle weakness, joint pain, leg swelling, rash, skin lesions, headache, fainting, dizziness, depression, anxiety, enlarged lymph nodes, easy bruising or bleeding, and environmental allergies.    Vital Signs:  Patient profile:   30 year old female Menstrual status:  regular Height:      64 inches Weight:      143 pounds Pulse rate:   65 / minute Resp:     14 per minute BP sitting:   110 / 70  (left arm)  Vitals Entered By: Ellender Hose RN (May 21, 2010 3:09 PM) CC: Chest pain and some dizziness   Physical Exam  General:  General: Well developed, well nourished, NAD HEENT: OP clear, mucus membranes moist SKIN: warm, dry Neuro: No focal deficits Musculoskeletal: Muscle strength 5/5 all ext Psychiatric: Mood and affect normal Neck: No JVD, no carotid bruits, no thyromegaly, no lymphadenopathy. Lungs:Clear bilaterally, no wheezes, rhonci, crackles CV: RRR no murmurs, gallops rubs Abdomen: soft, NT, ND, BS present Extremities: No edema, pulses 2+.    EKG  Procedure date:  05/21/2010  Findings:      NSR, rate 64 bpm. No ischemia.   Echocardiogram  Procedure date:  05/17/2010  Findings:      Left ventricle: The cavity size was normal. Wall thickness was     normal. Systolic  function was normal. The estimated ejection     fraction was in the range of 55% to 60%. Wall motion was normal;     there were no regional wall motion abnormalities.   - Atrial septum: No defect or patent foramen ovale was identified.  Impression & Recommendations:  Problem # 1:  CHEST PAIN UNSPECIFIED (ICD-786.50) Strong family history of CAD. Personal  history of hyperlipidemia. Exercise treadmilll to exclude ischemia. Will give her a trial of Nexium samples 40 mg per day to see if this helps her symptoms which could be GERD.   Her updated medication list for this problem includes:    Aspirin 81 Mg Tbec (Aspirin) .Marland Kitchen... Take one tablet by mouth daily  Her updated medication list for this problem includes:    Aspirin 81 Mg Tbec (Aspirin) .Marland Kitchen... Take one tablet by mouth daily  Orders: EKG w/ Interpretation (93000) Treadmill (Treadmill)  Problem # 2:  LIMB PAIN (ICD-729.5) Will repeat  left leg venous doppler to exclude DVT.   Other Orders: Venous Duplex Lower Extremity (Venous Duplex Lower)  Patient Instructions: 1)  Your physician recommends that you schedule a follow-up appointment in 2-3 weeks. 2)  Your physician recommends that you continue on your current medications as directed. Please refer to the Current Medication list given to you today. Please take Nexium 40mg  by mouth daily. We will give you samples today.  3)  Your physician has requested that you have a lower extremity venous duplex.  This test is an ultrasound of the veins in the legs. It looks at venous blood flow that carries blood from the heart to the legs or arms.  Allow one hour for a Lower Venous exam.  Allow thirty minutes for an Upper Venous exam. There are no restrictions or special instructions. 4)  Your physician has requested that you have an exercise tolerance test with Dr. Clifton James.  For further information please visit [email or web address]  Please also follow instruction sheet, as given.

## 2010-06-05 NOTE — Discharge Summary (Signed)
Carrie Morrison, Carrie Morrison              ACCOUNT NO.:  [id number]  MEDICAL RECORD NO.:  [id number]          PATIENT TYPE:  INP  LOCATION:  3741                         FACILITY:  [facility]  PHYSICIAN:  Ladell Pier, M.D.   DATE OF BIRTH:  Aug 28, 1980  DATE OF ADMISSION:  05/16/2010 DATE OF DISCHARGE:  05/17/2010                              DISCHARGE SUMMARY   DISCHARGE DIAGNOSES:  Chest pain and pulmonary nodule.  DISCHARGE MEDICATIONS:  Aspirin 81 mg per day.  PROCEDURES:  A 2-D echo done on January 14 showed normal LV function. EF of 55% to 60%, wall thickness was normal.  Systolic function was normal.  Cavity size was normal.  Wall motion was normal.  CONSULTANTS:  I discussed with Dr. Deborah Chalk on the phone, [facility].  HISTORY OF PRESENT ILLNESS:  The patient is a 30 year old female, previously healthy, on no chronic medications; presented to the emergency room with substernal chest pain intermittently, with left arm radiation and some nausea and diaphoresis for the past couple of days, who usually had no exertional chest pain and had been healthy prior to the birth of her first child.  She was exercising regularly.  She never had any exertional chest pain, but noted that she had some dyspnea on exertion.  There is no audible wheezing or cough with exercise.  She denied any abdominal cramping.  She has no calf swelling.  No prolonged transportation.  Please see admission note for remainder of history, past medical history, family history, social history, meds and allergies.  REVIEW OF SYSTEMS:  Per admission H and P.  PHYSICAL EXAMINATION:  VITAL SIGNS:  At the time of discharge, temperature 98, pulse of 80, respirations 16, blood pressure 102/63, pulse ox 98% on room air. GENERAL:  The patient is sitting up in bed, well-nourished white female, does not seem to be in any acute distress. HEENT:  Head is normocephalic, atraumatic.  Pupils reactive to light. Throat  without erythema. CARDIOVASCULAR:  Regular rate and rhythm. LUNGS:  Clear bilaterally. ABDOMEN:  Positive bowel sounds. EXTREMITIES:  No edema.  HOSPITAL COURSE: 1. Chest pain:  The patient was admitted to the hospital.  On     discussing with her, she has a strong family history of heart     disease on both sides of her family.  She stated that both parents     had premature coronary artery disease.  The patient was admitted to     the hospital, ruled out with serial cardiac markers and EKG.  Did     consult Cardiology per discussion with Dr. Deborah Chalk.  Review echo,     and if echo is normal, patient already has an appointment to follow     up with [facility] in the office on Wednesday, which is 4     days from today.  The patient was told to keep that appointment.     She also had a TSH done and D-dimer done that was normal, TSH that     was normal, and liver function tests that were normal.  The patient     also had CT  chest with contrast done that was normal except for a 6     mm nodule.  The patient is to follow up for a CT scan of the chest     in 6 months. 2. Pulmonary nodule.  The patient is to follow up for repeat CT scan     without contrast, to follow up on pulmonary nodule in 6 months.     Did discuss with the patient.  LABS AT THE TIME OF DISCHARGE:  CK 43, MB 0.8, troponin 0.01.  TSH 2.948, D-dimer 0.37.  Fasting lipid panel:  Cholesterol 210, triglycerides 50, HDL 58, and LDL 142.  Time spent with the patient in doing this discharge and talking to her husband is approximately 35 minutes.     Ladell Pier, M.D.     [place]/MEDQ  D:  05/17/2010  T:  05/18/2010  Job:  [id number]  Electronically Signed by Ladell Pier M.D. on 06/05/2010 01:37:42 PM

## 2010-06-11 NOTE — Progress Notes (Signed)
Summary: pt cancelled Methacholine Test -  Phone Note Call from Patient   Caller: Patient Call For: Dr. Marchelle Gearing Summary of Call: [phone] I called pt to inform her of the date and time of her Methacholine Challenge Test, which was to be done on Thurs. 06/05/10 at 9:00 at [facility]. Pt stated that she would need to cancel this test at this time. Pt stated that her insurance has denied some of her other tests and she wants to find out what is going before she has any additional expense. Pt stated that she would call us back to r/s once she has spoke to her insurance company.  Just wanted you to know.  Initial call taken by: Alfonso Ramus,  June 03, 2010 5:07 PM  Follow-up for Phone Call        ok that is fine. Please let her know that if symptoms persist she can try alternative budesonide (we can give samples) or QVAR (if she is not breast feeding or pregnant) and see if this helps Follow-up by: Kalman Shan MD,  June 03, 2010 5:33 PM  Additional Follow-up for Phone Call Additional follow up Details #1::        ATC pt.  Was unbale to leave message.  Mailbox was full.  Will try again later. Abigail Miyamoto RN  June 04, 2010 8:39 AM   Spoke with the pt and she states she feel fine at this point and once she gets her insurance starightened ut she will call back and r/s methacoline test. Carron Curie CMA  June 04, 2010 5:06 PM

## 2010-07-03 ENCOUNTER — Encounter: Payer: Self-pay | Admitting: Family Medicine

## 2010-07-03 ENCOUNTER — Ambulatory Visit (INDEPENDENT_AMBULATORY_CARE_PROVIDER_SITE_OTHER): Payer: BC Managed Care – PPO | Admitting: Family Medicine

## 2010-07-03 ENCOUNTER — Other Ambulatory Visit: Payer: Self-pay | Admitting: Family Medicine

## 2010-07-03 DIAGNOSIS — R42 Dizziness and giddiness: Secondary | ICD-10-CM

## 2010-07-03 DIAGNOSIS — N6320 Unspecified lump in the left breast, unspecified quadrant: Secondary | ICD-10-CM

## 2010-07-03 DIAGNOSIS — R229 Localized swelling, mass and lump, unspecified: Secondary | ICD-10-CM

## 2010-07-04 ENCOUNTER — Ambulatory Visit
Admission: RE | Admit: 2010-07-04 | Discharge: 2010-07-04 | Disposition: A | Discharge status: Home / Self Care | Payer: BC Managed Care – PPO | Source: Ambulatory Visit | Attending: Family Medicine | Admitting: Family Medicine

## 2010-07-04 DIAGNOSIS — N6320 Unspecified lump in the left breast, unspecified quadrant: Secondary | ICD-10-CM

## 2010-07-10 NOTE — Assessment & Plan Note (Signed)
Summary: still having dizziness///sph   Vital Signs:  Patient profile:   30 year old female Menstrual status:  regular Weight:      143.0 pounds Temp:     98.2 degrees F oral BP sitting:   102 / 70  (right arm) Cuff size:   regular  Vitals Entered By: Almeta Monas CMA Duncan Dull) (July 03, 2010 9:58 AM) CC: still having dizziness, sharo pain in the head and seeing wavy lines  Vision Screening:Left eye w/o correction: 20 / 20 Right Eye w/o correction: 20 / 15 Both eyes w/o correction:  20/ 15        Vision Entered By: Almeta Monas CMA Duncan Dull) (July 03, 2010 9:59 AM)   History of Present Illness: Pt here c/o con't dizziness with headache.  It will last several hours.  Pt also c/o swelling under L arm and tenderness---she noticed it a few weeks ago.  Current Medications (verified): 1)  Aspirin 81 Mg Tbec (Aspirin) .... Take One Tablet By Mouth Daily 2)  Antivert 25 Mg Tabs (Meclizine Hcl) .Marland Kitchen.. 1 By Mouth Qid  As Needed  Allergies (verified): 1)  ! Latex Exam Gloves (Disposable Gloves)  Past History:  Past Medical History: Last updated: 05/21/2010 Kidney stones  Migraines Headaches UTI Hyperlipidemia Uterine fibroids  Past Surgical History: Last updated: 05/21/2010 Caesarean Section 11/04/09 Rhinoplasty 2009 Oral surgery 1995  Family History: Last updated: 05/30/2010 Family History High cholesterol Family History Hypertension Family History of Sudden Death Family History of Cardiovascular disorder Family History of Colon CA 1st degree relative <60 Family History Diabetes 1st degree relative Family History of Stroke F 1st degree relative <60 Family History of CAD Female 1st degree relative <50 -- 30yo Family History of CAD Female 1st degree relative <60    Mother alive age 49, CAD with MI at age 76 Father deceased at age 70, CAD starting at age 13, died age 3. Paternal Grandfather with MI at age 46.  Brother - exercise induced asthma  Social History: Last  updated: 05/30/2010 Married, 1 child.Not breast feeding Former Smoker-Smoked for 10 years. 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  Otoscopic examination reveals clear canals, tympanic membranes are intact bilaterally without bulging, retraction, inflammation or discharge. Hearing is grossly normal bilaterally. Nose:  External nasal examination shows no deformity or inflammation. Nasal mucosa are pink and moist without lesions or exudates. Mouth:  Oral mucosa and oropharynx without lesions or exudates.  Teeth in good repair. Neck:  No deformities, masses, or tenderness noted. Lungs:  Normal respiratory effort, chest expands symmetrically. Lungs are clear to auscultation, no crackles or wheezes. Heart:  normal rate and no murmur.   Neurologic:  alert & oriented X3, strength normal in all extremities, and gait normal.   Skin:  Intact without suspicious lesions or rashes Psych:  Cognition and judgment appear intact. Alert and  cooperative with normal attention span and concentration. No apparent delusions, illusions, hallucinations   Impression & Recommendations:  Problem # 1:  DIZZINESS (ICD-780.4)  Her updated medication list for this problem includes:    Antivert 25 Mg Tabs  (Meclizine hcl) .Marland Kitchen... 1 by mouth qid  as needed  Orders: Neurology Referral (Neuro)  Problem # 2:  LOCALIZED SUPERFICIAL SWELLING MASS OR LUMP (ICD-782.2)  check mammogram  Orders: Radiology Referral (Radiology)  Complete Medication List: 1)  Aspirin 81 Mg Tbec (Aspirin) .... Take one tablet by mouth daily 2)  Antivert 25 Mg Tabs (Meclizine hcl) .Marland Kitchen.. 1 by mouth qid  as needed Prescriptions: ANTIVERT 25 MG TABS (MECLIZINE HCL) 1 by mouth qid  as needed  #30 x 0   Entered and Authorized by:   Loreen Freud DO   Signed by:   Loreen Freud DO on 07/03/2010   Method used:   Electronically to        [facility] [phone]* (retail)       [address]       [place], [place]  [address]       Ph: [phone] or [phone]       Fax: [phone]   RxID:   [id number]    Orders Added: 1)  Radiology Referral [Radiology] 2)  Neurology Referral [Neuro] 3)  Est. Patient Level IV [63875]

## 2010-07-20 LAB — CBC
HCT: 45.1 % (ref 36.0–46.0)
HCT: 38.3 % (ref 36.0–46.0)
Hemoglobin: 15.5 g/dL — ABNORMAL HIGH (ref 12.0–15.0)
Hemoglobin: 13.5 g/dL (ref 12.0–15.0)
MCH: 34.5 pg — ABNORMAL HIGH (ref 26.0–34.0)
MCV: 98.2 fL (ref 78.0–100.0)
MCV: 98.8 fL (ref 78.0–100.0)
MCV: 98.9 fL (ref 78.0–100.0)
MCV: 99 fL (ref 78.0–100.0)
Platelets: 191 10*3/uL (ref 150–400)
Platelets: 240 10*3/uL (ref 150–400)
Platelets: 216 10*3/uL (ref 150–400)
Platelets: 221 10*3/uL (ref 150–400)
Platelets: 226 10*3/uL (ref 150–400)
RBC: 4.56 MIL/uL (ref 3.87–5.11)
RBC: 3.87 MIL/uL (ref 3.87–5.11)
RBC: 3.91 MIL/uL (ref 3.87–5.11)
RDW: 12.4 % (ref 11.5–15.5)
WBC: 10.8 10*3/uL — ABNORMAL HIGH (ref 4.0–10.5)
WBC: 11.4 10*3/uL — ABNORMAL HIGH (ref 4.0–10.5)
WBC: 12.1 10*3/uL — ABNORMAL HIGH (ref 4.0–10.5)
WBC: 15.2 10*3/uL — ABNORMAL HIGH (ref 4.0–10.5)

## 2010-07-20 LAB — COMPREHENSIVE METABOLIC PANEL
AST: 24 U/L (ref 0–37)
AST: 25 U/L (ref 0–37)
Albumin: 2.6 g/dL — ABNORMAL LOW (ref 3.5–5.2)
Albumin: 2.8 g/dL — ABNORMAL LOW (ref 3.5–5.2)
Alkaline Phosphatase: 84 U/L (ref 39–117)
BUN: 7 mg/dL (ref 6–23)
CO2: 22 mEq/L (ref 19–32)
Chloride: 108 mEq/L (ref 96–112)
Chloride: 109 mEq/L (ref 96–112)
Chloride: 109 mEq/L (ref 96–112)
Creatinine, Ser: 0.62 mg/dL (ref 0.4–1.2)
Creatinine, Ser: 0.74 mg/dL (ref 0.4–1.2)
GFR calc Af Amer: >60 mL/min (ref >60)
GFR calc Af Amer: >60 mL/min (ref >60)
GFR calc non Af Amer: >60 mL/min (ref >60)
Glucose, Bld: 108 mg/dL — ABNORMAL HIGH (ref 70–99)
Potassium: 3.8 mEq/L (ref 3.5–5.1)
Potassium: 4 mEq/L (ref 3.5–5.1)
Total Bilirubin: 0.5 mg/dL (ref 0.3–1.2)
Total Bilirubin: 0.5 mg/dL (ref 0.3–1.2)
Total Bilirubin: 0.5 mg/dL (ref 0.3–1.2)
Total Protein: 5.1 g/dL — ABNORMAL LOW (ref 6.0–8.3)

## 2010-07-20 LAB — URINALYSIS, ROUTINE W REFLEX MICROSCOPIC
Bilirubin Urine: NEGATIVE
Ketones, ur: NEGATIVE mg/dL
Nitrite: NEGATIVE
Protein, ur: NEGATIVE mg/dL
Urobilinogen, UA: 0.2 mg/dL (ref 0.0–1.0)

## 2010-07-20 LAB — PROTEIN, URINE, 24 HOUR: Urine Total Volume-UPROT: 3975 mL

## 2010-07-20 LAB — CREATININE CLEARANCE, URINE, 24 HOUR
Creatinine, 24H Ur: 1499 mg/d (ref 700–1800)
Creatinine, Urine: 37.7 mg/dL
Creatinine: 0.63 mg/dL (ref 0.4–1.2)
Urine Total Volume-CRCL: 3975 mL

## 2010-07-20 LAB — STREP B DNA PROBE

## 2010-07-20 LAB — URIC ACID: Uric Acid, Serum: 5.2 mg/dL (ref 2.4–7.0)

## 2010-07-23 LAB — URINALYSIS, ROUTINE W REFLEX MICROSCOPIC
Glucose, UA: NEGATIVE mg/dL
Leukocytes, UA: NEGATIVE
Protein, ur: NEGATIVE mg/dL
pH: 6 (ref 5.0–8.0)

## 2010-07-23 LAB — URINE CULTURE

## 2010-07-23 LAB — URINE MICROSCOPIC-ADD ON

## 2010-08-04 ENCOUNTER — Emergency Department (HOSPITAL_BASED_OUTPATIENT_CLINIC_OR_DEPARTMENT_OTHER)
Admission: EM | Admit: 2010-08-04 | Discharge: 2010-08-04 | Disposition: A | Discharge status: Home / Self Care | Payer: BC Managed Care – PPO | Attending: Emergency Medicine | Admitting: Emergency Medicine

## 2010-08-04 DIAGNOSIS — M79609 Pain in unspecified limb: Secondary | ICD-10-CM | POA: Insufficient documentation

## 2010-08-04 DIAGNOSIS — R259 Unspecified abnormal involuntary movements: Secondary | ICD-10-CM | POA: Insufficient documentation

## 2010-08-04 DIAGNOSIS — R209 Unspecified disturbances of skin sensation: Secondary | ICD-10-CM | POA: Insufficient documentation

## 2010-08-04 LAB — BASIC METABOLIC PANEL
BUN: 13 mg/dL (ref 6–23)
CO2: 23 mEq/L (ref 19–32)
Calcium: 9.4 mg/dL (ref 8.4–10.5)
Chloride: 106 mEq/L (ref 96–112)
Creatinine, Ser: 0.7 mg/dL (ref 0.4–1.2)
GFR calc Af Amer: >60 mL/min (ref >60)
GFR calc non Af Amer: >60 mL/min (ref >60)
Glucose, Bld: 91 mg/dL (ref 70–99)
Potassium: 4.1 mEq/L (ref 3.5–5.1)
Sodium: 141 mEq/L (ref 135–145)

## 2010-08-05 ENCOUNTER — Ambulatory Visit: Payer: BC Managed Care – PPO | Admitting: Family Medicine

## 2010-08-22 ENCOUNTER — Encounter: Payer: Self-pay | Admitting: Cardiovascular Disease

## 2010-08-29 ENCOUNTER — Ambulatory Visit (INDEPENDENT_AMBULATORY_CARE_PROVIDER_SITE_OTHER): Payer: BC Managed Care – PPO | Admitting: Gynecology

## 2010-08-29 DIAGNOSIS — R82998 Other abnormal findings in urine: Secondary | ICD-10-CM

## 2010-08-29 DIAGNOSIS — N949 Unspecified condition associated with female genital organs and menstrual cycle: Secondary | ICD-10-CM

## 2010-08-29 DIAGNOSIS — Z113 Encounter for screening for infections with a predominantly sexual mode of transmission: Secondary | ICD-10-CM

## 2010-08-29 DIAGNOSIS — IMO0002 Reserved for concepts with insufficient information to code with codable children: Secondary | ICD-10-CM

## 2010-08-29 DIAGNOSIS — B373 Candidiasis of vulva and vagina: Secondary | ICD-10-CM

## 2010-08-29 DIAGNOSIS — N898 Other specified noninflammatory disorders of vagina: Secondary | ICD-10-CM

## 2010-09-01 ENCOUNTER — Encounter: Payer: Self-pay | Admitting: Cardiovascular Disease

## 2010-09-02 ENCOUNTER — Ambulatory Visit (INDEPENDENT_AMBULATORY_CARE_PROVIDER_SITE_OTHER): Payer: BC Managed Care – PPO | Admitting: Gynecology

## 2010-09-02 ENCOUNTER — Other Ambulatory Visit: Payer: BC Managed Care – PPO

## 2010-09-02 DIAGNOSIS — N949 Unspecified condition associated with female genital organs and menstrual cycle: Secondary | ICD-10-CM

## 2010-09-02 DIAGNOSIS — N946 Dysmenorrhea, unspecified: Secondary | ICD-10-CM

## 2010-09-02 DIAGNOSIS — N920 Excessive and frequent menstruation with regular cycle: Secondary | ICD-10-CM

## 2010-09-02 DIAGNOSIS — N8300 Follicular cyst of ovary, unspecified side: Secondary | ICD-10-CM

## 2010-12-23 ENCOUNTER — Encounter: Payer: Self-pay | Admitting: Gynecology

## 2010-12-24 ENCOUNTER — Ambulatory Visit (INDEPENDENT_AMBULATORY_CARE_PROVIDER_SITE_OTHER): Payer: BC Managed Care – PPO | Admitting: Gynecology

## 2010-12-24 ENCOUNTER — Encounter: Payer: Self-pay | Admitting: Gynecology

## 2010-12-24 DIAGNOSIS — B3731 Acute candidiasis of vulva and vagina: Secondary | ICD-10-CM

## 2010-12-24 DIAGNOSIS — B370 Candidal stomatitis: Secondary | ICD-10-CM

## 2010-12-24 DIAGNOSIS — N898 Other specified noninflammatory disorders of vagina: Secondary | ICD-10-CM

## 2010-12-24 DIAGNOSIS — B373 Candidiasis of vulva and vagina: Secondary | ICD-10-CM

## 2010-12-24 MED ORDER — FLUCONAZOLE 200 MG PO TABS: 200.0000 mg | ORAL_TABLET | Freq: Every day | ORAL | Status: AC

## 2010-12-24 NOTE — Progress Notes (Signed)
Patient presents complaining of vaginal discharge with itching as well as 3 weeks of a sore in her mouth. She was treated for yeast with Diflucan 150 mg x1 dose in April.  Exam Oral: Patient is pointing to the under aspect of her lower lip and I cannot see any abnormalities. Pelvic: External BUS vagina with white discharge KOH wet prep done cervix normal uterus normal size midline mobile nontender adnexa without masses or tenderness  Assessment and plan: KOH wet prep positive for yeast. A think that she's having thrush also in her mouth as she said it feels sore or irritated but I do not see any abnormalities the mucosa is smooth and normal in appearance and does not palpate abnormal. To cover her with Diflucan 200 daily for 7 days followup if symptoms persist or recur.

## 2010-12-26 ENCOUNTER — Telehealth: Payer: Self-pay | Admitting: *Deleted

## 2010-12-26 NOTE — Telephone Encounter (Signed)
PT C/O NAUSEA, DIZZINESS, THROAT TIGHTNESS. PT BELIEVES THAT SHE HAD AN ALLERGIC REACTION TO THE DIFLUCAN THAT WAS GIVEN ON RECENT OFFICE VISIT. [place], I TOLD HER TO SEEK MEDICAL ATTENTION TO BE SEEN. SHE ONLY TOOK 1 PILL,SHE WAS INFORMED TO STOP TAKING UNTIL SEEN  MEDICALLY.

## 2011-01-27 LAB — POCT HEMOGLOBIN-HEMACUE: Hemoglobin: 17.4 — ABNORMAL HIGH

## 2011-02-05 ENCOUNTER — Other Ambulatory Visit: Payer: Self-pay | Admitting: Internal Medicine

## 2011-02-05 DIAGNOSIS — R911 Solitary pulmonary nodule: Secondary | ICD-10-CM

## 2011-02-06 ENCOUNTER — Ambulatory Visit
Admission: RE | Admit: 2011-02-06 | Discharge: 2011-02-06 | Disposition: A | Discharge status: Home / Self Care | Payer: BC Managed Care – PPO | Source: Ambulatory Visit | Attending: Internal Medicine | Admitting: Internal Medicine

## 2011-02-06 DIAGNOSIS — R911 Solitary pulmonary nodule: Secondary | ICD-10-CM

## 2011-02-13 LAB — BASIC METABOLIC PANEL
Calcium: 9.4
Creatinine, Ser: 0.78
GFR calc Af Amer: >60

## 2011-02-13 LAB — CBC
MCHC: 35.3
RBC: 4.41
WBC: 13.1 — ABNORMAL HIGH

## 2011-02-13 LAB — DIFFERENTIAL
Basophils Relative: 1
Lymphs Abs: 3.7 — ABNORMAL HIGH
Monocytes Relative: 4
Neutro Abs: 8.3 — ABNORMAL HIGH
Neutrophils Relative %: 63

## 2011-02-13 LAB — URINALYSIS, ROUTINE W REFLEX MICROSCOPIC
Bilirubin Urine: NEGATIVE
Protein, ur: NEGATIVE
Urobilinogen, UA: 0.2

## 2011-02-13 LAB — PREGNANCY, URINE: Preg Test, Ur: NEGATIVE

## 2011-02-13 LAB — URINE MICROSCOPIC-ADD ON

## 2011-02-13 LAB — POCT URINE HEMOGLOBIN: Hgb urine dipstick: POSITIVE — AB

## 2011-03-24 ENCOUNTER — Encounter: Payer: Self-pay | Admitting: Gynecology

## 2011-03-24 ENCOUNTER — Ambulatory Visit (INDEPENDENT_AMBULATORY_CARE_PROVIDER_SITE_OTHER): Payer: BC Managed Care – PPO | Admitting: Gynecology

## 2011-03-24 VITALS — BP 110/70

## 2011-03-24 DIAGNOSIS — R102 Pelvic and perineal pain: Secondary | ICD-10-CM

## 2011-03-24 DIAGNOSIS — N949 Unspecified condition associated with female genital organs and menstrual cycle: Secondary | ICD-10-CM

## 2011-03-24 NOTE — Progress Notes (Signed)
Patient presents with several month history of left pelvic pain comes and goes but seemed more intense after her last period for the last week or so. No diarrhea constipation or urinary symptoms but a nagging deep pelvic discomfort. She does have a history that is suggestive of endometriosis to include worsening dysmenorrhea, dyspareunia and this pelvic discomfort. I talked to her previously about laparoscopy or some other form of suppressive therapy such as low dose oral contraceptives and she has to this point declined.  Exam Abdomen soft nontender without masses guarding rebound organomegaly Pelvic external BUS vagina normal. Cervix normal. Uterus normal size midline mobile nontender adnexa without masses or tenderness.  Assessment and plan: Left-sided pain suspicious for endometriosis. I again discussed options to include oral contraceptives or some other form of suppressive therapy such as IUD, Depo-Lupron up to and including laparoscopy. I discussed laparoscopy in general and again she is not interested in pursuing this at this point. I recommend we get a baseline ultrasound now to reassess the pelvis issue we'll go ahead and schedule this.

## 2011-03-31 ENCOUNTER — Encounter: Payer: Self-pay | Admitting: Gynecology

## 2011-03-31 ENCOUNTER — Ambulatory Visit (INDEPENDENT_AMBULATORY_CARE_PROVIDER_SITE_OTHER): Payer: BC Managed Care – PPO | Admitting: Gynecology

## 2011-03-31 ENCOUNTER — Ambulatory Visit (INDEPENDENT_AMBULATORY_CARE_PROVIDER_SITE_OTHER): Payer: BC Managed Care – PPO

## 2011-03-31 VITALS — BP 110/60

## 2011-03-31 DIAGNOSIS — N949 Unspecified condition associated with female genital organs and menstrual cycle: Secondary | ICD-10-CM

## 2011-03-31 DIAGNOSIS — IMO0002 Reserved for concepts with insufficient information to code with codable children: Secondary | ICD-10-CM

## 2011-03-31 DIAGNOSIS — R102 Pelvic and perineal pain: Secondary | ICD-10-CM

## 2011-03-31 DIAGNOSIS — N946 Dysmenorrhea, unspecified: Secondary | ICD-10-CM

## 2011-03-31 NOTE — Patient Instructions (Signed)
We will arrange for GI evaluation. From a gynecology standpoint consider laparoscopy, menstrual suppression such as low-dose oral contraceptives possible Depo-Lupron medication.

## 2011-03-31 NOTE — Progress Notes (Signed)
Patient is sent for ultrasound which is normal.  Reviewed with patient her symptoms again are suspicious for endometriosis and that she gets primarily left-sided pelvic pain worse during her menses sometimes mid cycle around the time of ovulation. She has no nausea vomiting diarrhea constipation. She has been diagnosed with IBS as a teenager and she asked about GI possibilities. I offered GI referral and she wants to go ahead and do this. From a gynecologic standpoint I recommend laparoscopy or menstrual suppression such as low-dose oral contraceptives possible Mirena IUD up to and including Depo-Lupron were all reviewed. Patient wants to start with gastroenterology and then we'll go from there.

## 2011-05-08 ENCOUNTER — Telehealth: Payer: Self-pay | Admitting: Internal Medicine

## 2011-05-08 NOTE — Telephone Encounter (Signed)
Looks like last CT Chest was done 02/06/11- does she even need another one yet MR? Pls advise, thanks!

## 2011-05-11 ENCOUNTER — Other Ambulatory Visit: Payer: Self-pay

## 2011-05-11 NOTE — Telephone Encounter (Signed)
No change in nodule Jan 2012 -> Oct 2012, 5mm RLL. No other nodule. Age < 35. Very limited smoker. As long as she is not smoking now, no further CT scan needed. No change in 10 months wthich is good enough. So no need for fu unless she is having resp symptoms. Please let her know if she relapses into smoking lung cancer risk remains.

## 2011-05-11 NOTE — Telephone Encounter (Signed)
Ideally needs 1 year fu CT for 5.15mm nodule. However, risk of cancer is low and I had discussed this at visit in Jan 2012 1 year ago. If she is comfortable accepting risk then no need for CT . She has right to refuse anyways but please re-clarify the above statement. No fu needed unless she wants one

## 2011-05-11 NOTE — Telephone Encounter (Signed)
Spoke to patient and her primary md ordered a ct chest in October and this is in the system, she will be due a year from that one which will be 02/2012, I advised pt to make a f/u with Dr Marchelle Gearing as the time gets closer so she can have her visit and get the ct f/u that is needed. Pt agreed and verbalized understanding. Will forward to MR as FYI only nothing further needed at this time.

## 2011-05-12 NOTE — Telephone Encounter (Signed)
And is doing fine. Pt aware to call us if she begins to have any problems. Pt voiced her understanding and needed nothing further

## 2011-05-12 NOTE — Telephone Encounter (Signed)
I spoke with pt and made her aware of MR response. She voiced her understanding and states she is still not smoking.

## 2011-05-24 ENCOUNTER — Emergency Department (HOSPITAL_BASED_OUTPATIENT_CLINIC_OR_DEPARTMENT_OTHER)
Admission: EM | Admit: 2011-05-24 | Discharge: 2011-05-24 | Disposition: A | Discharge status: Home / Self Care | Payer: BC Managed Care – PPO | Attending: Emergency Medicine | Admitting: Emergency Medicine

## 2011-05-24 ENCOUNTER — Encounter (HOSPITAL_BASED_OUTPATIENT_CLINIC_OR_DEPARTMENT_OTHER): Payer: Self-pay | Admitting: *Deleted

## 2011-05-24 DIAGNOSIS — R221 Localized swelling, mass and lump, neck: Secondary | ICD-10-CM | POA: Insufficient documentation

## 2011-05-24 DIAGNOSIS — T7840XA Allergy, unspecified, initial encounter: Secondary | ICD-10-CM

## 2011-05-24 DIAGNOSIS — L500 Allergic urticaria: Secondary | ICD-10-CM | POA: Insufficient documentation

## 2011-05-24 DIAGNOSIS — R220 Localized swelling, mass and lump, head: Secondary | ICD-10-CM | POA: Insufficient documentation

## 2011-05-24 MED ORDER — PREDNISONE 50 MG PO TABS
60.0000 mg | ORAL_TABLET | Freq: Once | ORAL | Status: AC
  Administered 2011-05-24: 60 mg via ORAL
  Filled 2011-05-24: qty 1

## 2011-05-24 MED ORDER — PREDNISONE 10 MG PO TABS: 20.0000 mg | ORAL_TABLET | Freq: Every day | ORAL | Status: DC

## 2011-05-24 NOTE — ED Notes (Signed)
Pt states she feels like her symptoms are improving, throat doesn't "feel as tight"

## 2011-05-24 NOTE — ED Notes (Signed)
Ray MD at bedside 

## 2011-05-24 NOTE — ED Provider Notes (Signed)
History   This chart was scribed for Hilario Quarry, MD by Melba Coon. The patient was seen in room MH10/MH10 and the patient's care was started at .    CSN: [id number]  Arrival date & time 05/24/11  1645   First MD Initiated Contact with Patient 05/24/11 1739      Chief Complaint  Patient presents with  . Allergic Reaction    (Consider location/radiation/quality/duration/timing/severity/associated sxs/prior treatment) HPI Carrie Morrison is a 31 y.o. female who presents to the Emergency Department complaining of a one-time, moderate to severe allergic reaction to latex with an onset this afternoon. 6 months ago, pt found out about latex allergy. This afternoon, husband was painting in the house and pt went to drive her daughter somewhere when she noticed the reaction (swelling in tongue/mouth, no rash). Pt went back home and noticed that paint being used was latex-based. Pt took Claritin, called PCP which told her to use Benadryl. Pt had difficulty swallowing but no wheezing. At time of exam, swelling had diminished No HTN, but family Hx of heart attack (mother and father).  PCP: Dr Selena Batten in [place]  History reviewed. No pertinent past medical history.  Past Surgical History  Procedure Date  . Cesarean section   . Mouth surgery   . Rhinoplasty     Family History  Problem Relation Age of Onset  . Heart disease Mother   . Heart disease Father   . Hypertension Father   . Cancer Paternal Grandmother     Colon cancer  . Diabetes Paternal Grandmother     History  Substance Use Topics  . Smoking status: Former Smoker    Quit date: 12/23/2008  . Smokeless tobacco: Never Used  . Alcohol Use: Yes     socially    OB History    Grav Para Term Preterm Abortions TAB SAB Ect Mult Living   1 1  1      1       Review of Systems 10 Systems reviewed and are negative for acute change except as noted in the HPI.  Allergies  Diflucan and Latex  Home Medications    Current Outpatient Rx  Name Route Sig Dispense Refill  . ASPIRIN 81 MG PO TABS Oral Take 81 mg by mouth daily.      Marland Kitchen DIPHENHYDRAMINE HCL 12.5 MG/5ML PO LIQD Oral Take 25 mg by mouth once.    Marland Kitchen LORATADINE 10 MG PO TABS Oral Take 10 mg by mouth daily.      BP 137/92  Pulse 82  Temp(Src) 98.9 F (37.2 C) (Oral)  Resp 18  Ht 5\' 2"  (1.575 m)  Wt 137 lb (62.143 kg)  BMI 25.06 kg/m2  SpO2 100%  LMP 05/15/2011  Physical Exam  Constitutional: She is oriented to person, place, and time. She appears well-developed and well-nourished.  HENT:  Head: Normocephalic and atraumatic.  Eyes: Conjunctivae and EOM are normal. Pupils are equal, round, and reactive to light. No scleral icterus.  Neck: Normal range of motion. Neck supple. No thyromegaly present.  Cardiovascular: Normal rate, regular rhythm and normal heart sounds.  Exam reveals no gallop and no friction rub.   No murmur heard. Pulmonary/Chest: Effort normal and breath sounds normal. No stridor. She has no wheezes. She has no rales. She exhibits no tenderness.  Abdominal: Soft. She exhibits no distension. There is no tenderness. There is no rebound.  Musculoskeletal: Normal range of motion. She exhibits no edema.  Lymphadenopathy:    She  has no cervical adenopathy.  Neurological: She is oriented to person, place, and time. Coordination normal.  Skin: Skin is warm. No rash noted. No erythema.  Psychiatric: She has a normal mood and affect. Her behavior is normal.    ED Course  Procedures (including critical care time)  DIAGNOSTIC STUDIES: Oxygen Saturation is 100% on room air, normal by my interpretation.    COORDINATION OF CARE:     Labs Reviewed - No data to display No results found.   No diagnosis found.    MDM  Patient remained stable here with no evidence of airway obstruction. She has been given prednisone here on the is given a prescription for prednisone at home. She is to continue the Benadryl. She is to  return if she is having any new problems or return of symptoms of airway obstruction. Patient advised to avoid all allergens which may mean not returning to her home tonight. This is discussed with patient. I personally performed the services described in this documentation, which was scribed in my presence. The recorded information has been reviewed and considered.        Hilario Quarry, MD 05/24/11 2011

## 2011-05-24 NOTE — ED Notes (Signed)
Pt states she is allergic to latex and her husband was painting with latex paint. Feels like her tongue is swelling and throat is closing. O2 sat 99% at triage. Speaking full sentences. No distress. Took Claritin and Benadryl.

## 2011-07-10 ENCOUNTER — Emergency Department (HOSPITAL_BASED_OUTPATIENT_CLINIC_OR_DEPARTMENT_OTHER)
Admission: EM | Admit: 2011-07-10 | Discharge: 2011-07-10 | Disposition: A | Discharge status: Home / Self Care | Payer: BC Managed Care – PPO | Attending: Emergency Medicine | Admitting: Emergency Medicine

## 2011-07-10 ENCOUNTER — Encounter (HOSPITAL_BASED_OUTPATIENT_CLINIC_OR_DEPARTMENT_OTHER): Payer: Self-pay | Admitting: *Deleted

## 2011-07-10 DIAGNOSIS — M79606 Pain in leg, unspecified: Secondary | ICD-10-CM

## 2011-07-10 DIAGNOSIS — M79609 Pain in unspecified limb: Secondary | ICD-10-CM | POA: Insufficient documentation

## 2011-07-10 HISTORY — DX: Gastro-esophageal reflux disease without esophagitis: K21.9

## 2011-07-10 LAB — DIFFERENTIAL
Basophils Absolute: 0.1 10*3/uL (ref 0.0–0.1)
Lymphocytes Relative: 33 % (ref 12–46)
Monocytes Absolute: 0.5 10*3/uL (ref 0.1–1.0)
Neutro Abs: 4.5 10*3/uL (ref 1.7–7.7)
Neutrophils Relative %: 57 % (ref 43–77)

## 2011-07-10 LAB — CBC
HCT: 41.8 % (ref 36.0–46.0)
Hemoglobin: 14.3 g/dL (ref 12.0–15.0)
RDW: 11.4 % — ABNORMAL LOW (ref 11.5–15.5)
WBC: 7.9 10*3/uL (ref 4.0–10.5)

## 2011-07-10 LAB — D-DIMER, QUANTITATIVE: D-Dimer, Quant: 0.26 ug/mL-FEU (ref 0.00–0.48)

## 2011-07-10 LAB — BASIC METABOLIC PANEL
CO2: 24 mEq/L (ref 19–32)
Chloride: 104 mEq/L (ref 96–112)
Creatinine, Ser: 0.7 mg/dL (ref 0.50–1.10)
GFR calc Af Amer: >90 mL/min (ref >90)
Potassium: 3.8 mEq/L (ref 3.5–5.1)

## 2011-07-10 NOTE — ED Notes (Signed)
Pt. Has warm dry skin and no noted discoloration on the L leg

## 2011-07-10 NOTE — ED Notes (Signed)
Pt. In no resp. Distress with no cough.  Only reports pain in the L lower leg.  Bounding pedal pulse noted bilat.

## 2011-07-10 NOTE — ED Notes (Signed)
Vital signs stable. 

## 2011-07-10 NOTE — ED Provider Notes (Signed)
History     CSN: [id number]  Arrival date & time 07/10/11  1539   First MD Initiated Contact with Patient 07/10/11 1735      Chief Complaint  Patient presents with  . Leg Pain    (Consider location/radiation/quality/duration/timing/severity/associated sxs/prior treatment) Patient is a 31 y.o. female presenting with leg pain. The history is provided by the patient. No language interpreter was used.  Leg Pain  The incident occurred more than 1 week ago. There was no injury mechanism. The pain is present in the left leg. The quality of the pain is described as aching. The pain is mild. The pain has been constant since onset. Pertinent negatives include no numbness, no inability to bear weight and no muscle weakness. She reports no foreign bodies present. The symptoms are aggravated by activity. She has tried NSAIDs for the symptoms. The treatment provided no relief.  Pt reports she has had pain in her left leg for 2 weeks.  Pt complains of soreness to her calf muscle.  Pt reports she has a family history of blood clots.  Pt saw her Md who advised her to take a muscle relaxer.   Past Medical History  Diagnosis Date  . GERD (gastroesophageal reflux disease)     Past Surgical History  Procedure Date  . Cesarean section   . Mouth surgery   . Rhinoplasty     Family History  Problem Relation Age of Onset  . Heart disease Mother   . Heart disease Father   . Hypertension Father   . Cancer Paternal Grandmother     Colon cancer  . Diabetes Paternal Grandmother     History  Substance Use Topics  . Smoking status: Former Smoker    Quit date: 12/23/2008  . Smokeless tobacco: Never Used  . Alcohol Use: Yes     socially    OB History    Grav Para Term Preterm Abortions TAB SAB Ect Mult Living   1 1  1      1       Review of Systems  Neurological: Negative for numbness.  All other systems reviewed and are negative.    Allergies  Diflucan and Latex  Home Medications    Current Outpatient Rx  Name Route Sig Dispense Refill  . ACETAMINOPHEN 500 MG PO TABS Oral Take 500 mg by mouth every 6 (six) hours as needed. Patient used this medication for cramps.    . ASPIRIN 81 MG PO TABS Oral Take 81 mg by mouth daily.      Marland Kitchen DIPHENHYDRAMINE HCL 12.5 MG/5ML PO LIQD Oral Take 25 mg by mouth once.    Marland Kitchen LORATADINE 10 MG PO TABS Oral Take 10 mg by mouth daily.    Marland Kitchen OMEPRAZOLE 20 MG PO CPDR Oral Take 20 mg by mouth daily.    Marland Kitchen PREDNISONE 10 MG PO TABS Oral Take 2 tablets (20 mg total) by mouth daily. 15 tablet 0    BP 125/81  Pulse 81  Temp(Src) 97.8 F (36.6 C) (Oral)  Resp 18  SpO2 100%  LMP 07/08/2011  Physical Exam  Constitutional: She is oriented to person, place, and time. She appears well-developed and well-nourished.  HENT:  Head: Normocephalic and atraumatic.  Musculoskeletal: She exhibits tenderness.       Tender left calf,  Negative homans,    Neurological: She is alert and oriented to person, place, and time.  Skin: Skin is warm.  Psychiatric: She has a normal mood and affect.  ED Course  Procedures (including critical care time)  Labs Reviewed  CBC - Abnormal; Notable for the following:    RDW 11.4 (*)    All other components within normal limits  D-DIMER, QUANTITATIVE  DIFFERENTIAL  BASIC METABOLIC PANEL   No results found.   No diagnosis found.    MDM   Results for orders placed during the hospital encounter of 07/10/11  D-DIMER, QUANTITATIVE      Component Value Range   D-Dimer, Quant 0.26  0.00 - 0.48 (ug/mL-FEU)  CBC      Component Value Range   WBC 7.9  4.0 - 10.5 (K/uL)   RBC 4.46  3.87 - 5.11 (MIL/uL)   Hemoglobin 14.3  12.0 - 15.0 (g/dL)   HCT 40.9  81.1 - 91.4 (%)   MCV 93.7  78.0 - 100.0 (fL)   MCH 32.1  26.0 - 34.0 (pg)   MCHC 34.2  30.0 - 36.0 (g/dL)   RDW 78.2 (*) 95.6 - 15.5 (%)   Platelets 292  150 - 400 (K/uL)  DIFFERENTIAL      Component Value Range   Neutrophils Relative 57  43 - 77 (%)    Neutro Abs 4.5  1.7 - 7.7 (K/uL)   Lymphocytes Relative 33  12 - 46 (%)   Lymphs Abs 2.6  0.7 - 4.0 (K/uL)   Monocytes Relative 6  3 - 12 (%)   Monocytes Absolute 0.5  0.1 - 1.0 (K/uL)   Eosinophils Relative 3  0 - 5 (%)   Eosinophils Absolute 0.3  0.0 - 0.7 (K/uL)   Basophils Relative 1  0 - 1 (%)   Basophils Absolute 0.1  0.0 - 0.1 (K/uL)  BASIC METABOLIC PANEL      Component Value Range   Sodium 138  135 - 145 (mEq/L)   Potassium 3.8  3.5 - 5.1 (mEq/L)   Chloride 104  96 - 112 (mEq/L)   CO2 24  19 - 32 (mEq/L)   Glucose, Bld 85  70 - 99 (mg/dL)   BUN 11  6 - 23 (mg/dL)   Creatinine, Ser 2.13  0.50 - 1.10 (mg/dL)   Calcium 9.3  8.4 - 08.6 (mg/dL)   GFR calc non Af Amer >90  >90 (mL/min)   GFR calc Af Amer >90  >90 (mL/min)   No results found.   I counseled pt, i think pain is muscular,  I advised follow up with Dr. Pearletha Forge for recheck.  Ice to area of pain. Ibuprofen      Lonia Skinner [place], [place] 07/10/11 2007

## 2011-07-10 NOTE — Discharge Instructions (Signed)
Muscle Strain A muscle strain, or pulled muscle, occurs when a muscle is over-stretched. A small number of muscle fibers may also be torn. This is especially common in athletes. This happens when a sudden violent force placed on a muscle pushes it past its capacity. Usually, recovery from a pulled muscle takes 1 to 2 weeks. But complete healing will take 5 to 6 weeks. There are millions of muscle fibers. Following injury, your body will usually return to normal quickly. HOME CARE INSTRUCTIONS   While awake, apply ice to the sore muscle for 15 to 20 minutes each hour for the first 2 days. Put ice in a plastic bag and place a towel between the bag of ice and your skin.   Do not use the pulled muscle for several days. Do not use the muscle if you have pain.   You may wrap the injured area with an elastic bandage for comfort. Be careful not to bind it too tightly. This may interfere with blood circulation.   Only take over-the-counter or prescription medicines for pain, discomfort, or fever as directed by your caregiver. Do not use aspirin as this will increase bleeding (bruising) at injury site.   Warming up before exercise helps prevent muscle strains.  SEEK MEDICAL CARE IF:  There is increased pain or swelling in the affected area. MAKE SURE YOU:   Understand these instructions.   Will watch your condition.   Will get help right away if you are not doing well or get worse.  Document Released: 04/20/2005 Document Revised: 04/09/2011 Document Reviewed: 11/17/2006 [employer] Patient Information 2012 [employer]. 

## 2011-07-10 NOTE — ED Notes (Signed)
Pt amb to triage with quick steady gait in nad. Pt reports 2 weeks of left leg pains, states she has family hx of clots and wants to get it checked out.

## 2011-07-11 NOTE — ED Provider Notes (Signed)
Medical screening examination/treatment/procedure(s) were performed by non-physician practitioner and as supervising physician I was immediately available for consultation/collaboration.   Dione Booze, MD 07/11/11 [phone]

## 2011-07-12 ENCOUNTER — Emergency Department (HOSPITAL_BASED_OUTPATIENT_CLINIC_OR_DEPARTMENT_OTHER)
Admission: EM | Admit: 2011-07-12 | Discharge: 2011-07-12 | Disposition: A | Discharge status: Home Health | Payer: BC Managed Care – PPO | Attending: Emergency Medicine | Admitting: Emergency Medicine

## 2011-07-12 ENCOUNTER — Encounter (HOSPITAL_BASED_OUTPATIENT_CLINIC_OR_DEPARTMENT_OTHER): Payer: Self-pay | Admitting: *Deleted

## 2011-07-12 ENCOUNTER — Emergency Department (INDEPENDENT_AMBULATORY_CARE_PROVIDER_SITE_OTHER): Payer: BC Managed Care – PPO

## 2011-07-12 DIAGNOSIS — M79609 Pain in unspecified limb: Secondary | ICD-10-CM | POA: Insufficient documentation

## 2011-07-12 DIAGNOSIS — K219 Gastro-esophageal reflux disease without esophagitis: Secondary | ICD-10-CM | POA: Insufficient documentation

## 2011-07-12 DIAGNOSIS — M79606 Pain in leg, unspecified: Secondary | ICD-10-CM

## 2011-07-12 DIAGNOSIS — Z79899 Other long term (current) drug therapy: Secondary | ICD-10-CM | POA: Insufficient documentation

## 2011-07-12 DIAGNOSIS — Z7982 Long term (current) use of aspirin: Secondary | ICD-10-CM | POA: Insufficient documentation

## 2011-07-12 DIAGNOSIS — IMO0001 Reserved for inherently not codable concepts without codable children: Secondary | ICD-10-CM | POA: Insufficient documentation

## 2011-07-12 MED ORDER — ENOXAPARIN SODIUM 60 MG/0.6ML ~~LOC~~ SOLN
60.0000 mg | Freq: Once | SUBCUTANEOUS | Status: DC
  Filled 2011-07-12: qty 0.6

## 2011-07-12 MED ORDER — HYDROCODONE-ACETAMINOPHEN 5-325 MG PO TABS: 2.0000 | ORAL_TABLET | ORAL | Status: AC | PRN

## 2011-07-12 NOTE — ED Notes (Signed)
Patient states that she was here on Friday for L lower leg pain and concerned that it still hurts and states she has been running a fever around 100.8 since that time. States it feels better when standing , but hurts from calf to back of knee

## 2011-07-12 NOTE — ED Notes (Signed)
Patient has been scheduled for 1130 AM ultra Sound tomorrow Monday 07/13/11.  Patient was given instructions as to which entrance to enter and where to go for the Radiology Department.  Patient was told to arrive at 1115 AM.  Sherrine Maples

## 2011-07-12 NOTE — Discharge Instructions (Signed)
Enoxaparin, Home Use  Enoxaparin (Lovenox) injection is a medication used to prevent clots from developing in your veins. Medications such as enoxaparin are called blood thinners or anticoagulants. If blood clots are untreated they could travel to your lungs. This is called a pulmonary embolus. A blood clot in your lungs can be fatal.  Caregivers often use anticoagulants such as enoxaparin to prevent clots following surgery. It is also used along with aspirin when the heart is not getting enough blood.  Usually another anticoagulant called warfarin (Coumadin) is started in 2 to 3 days after the enoxaparin is started. The enoxaparin is usually continued until the other anticoagulants have begun to work. Your caregiver will judge this length of time by measuring the length of time that it takes your blood to clot.  RISKS AND COMPLICATIONS   If you have received recent epidural anesthesia, spinal anesthesia, or a spinal tap while receiving anticoagulants, you are at risk for developing a blood clot in or around the spine. This condition could result in long-term or permanent paralysis.   Because anticoagulants thin your blood, severe bleeding may occur from any tissue or organ. Symptoms of the blood being too thin may include:   Bleeding from the nose or gums that does not stop quickly.   Unusual bruising or bruising easily.   Swelling or pain at an injection site.   A cut that does not stop bleeding within 10 minutes.   Continual nausea for more than 1 day or vomiting blood.   Coughing up blood.   Blood in the urine which may appear as pink, red, or brown urine.   Blood in bowel movements which may appear as red, dark or black stools.   Sudden weakness or numbness of the face, arm, or leg, especially on one side of the body.   Sudden confusion.   Trouble speaking (aphasia) or understanding.   Sudden trouble seeing in one or both eyes.   Sudden trouble walking.   Dizziness.   Loss of balance or  coordination.   Severe pain, such as a headache, joint pain, or back pain.   Fever.   Bruising around the injection sites may be expected.   Platelet drops, known as "thrombocytopenia," can occur with enoxaparin use. A condition called "heparin-induced thrombocytopenia" has been seen. If you have had this condition, you should tell your caregiver. Your caregiver may direct you to have blood tests to monitor this condition.   Do not use if you have allergies to the medication, heparin, or pork products.   Other side effects may include mild local reactions or irritation at the site of injection, pain, bruising, and redness of skin.  HOME CARE INSTRUCTIONS  You will be instructed by your caregiver how to give enoxaparin injections.  1. Before giving your medication you should make sure the injection is a clear and colorless or pale yellow solution. If your medication becomes discolored or has particles in the bottle, do not use and notify your caregiver.  2. When using the 30 and 40 mg pre-filled syringes, do not expel the air bubble from the syringe before the injection. This makes sure you use all the medication in the syringe.  3. The injections will be given subcutaneously. This means it is given into the fat over the belly (abdomen). It is given deep beneath the skin but not into the muscle. The shots should be injected around the abdominal wall. Change the sites of injection each time. The whole length   of the needle should be introduced into a skin fold held between the thumb and forefinger; the skin fold should be held throughout the injection. Do not rub the injection site after completion of the injection. This increases bruising. Enoxaparin injection pre-filled syringes and graduated pre-filled syringes are available with a system that shields the needle after injection.  4. Inject by pushing the plunger to the bottom of the syringe.  5. Remove the syringe from the injection site keeping your finger on  the plunger rod. Be careful not to stick yourself or others.  6. After injection and the syringe is empty, set off the safety system by firmly pushing the plunger rod. The protective sleeve will automatically cover the needle and you can hear a click. The click means your needle is safely covered. Do not try replacing the needle shield.  7. Get rid of the syringe in the nearest sharps container.  8. Keep your medication safely stored at room temperatures.   Due to the complications of anticoagulants, it is very important that you take your anticoagulant as directed by your caregiver. Anticoagulants need to be taken exactly as instructed. Be sure you understand all your anticoagulant instructions.   Changes in medicines, supplements, diet, and illness can affect your anticoagulation therapy. Be sure to inform your caregivers of any of these changes.   While on anticoagulants, you will need to have blood tests done routinely as directed by your caregivers.   Be careful not to cut yourself when using sharp objects.   Avoid heavy or variable alcohol use. Consume alcohol only in very limited quantities. General alcohol intake guidelines are 1 drink for nonpregnant women and 2 drinks for men per day. (1 drink = 5 ounces of wine, 12 ounces of beer, or 1 ounces of liquor). A sudden increase in alcohol use can increase your risk of bleeding. Chronic alcohol use can cause warfarin to be less effective.   Limit physical activities or sports that could result in a fall or cause injury.   It is extremely important that you tell all of your caregivers and dentist that you are taking an anticoagulant, especially if you are injured or plan to have any type of procedure or operation.   Follow up with your laboratory test and caregiver appointments as directed. It is very important to keep your appointments. Not keeping appointments could result in a chronic or permanent injury, pain, or disability.  SEEK MEDICAL CARE  IF:   You develop any rashes.   You have any worsening of the condition for which you are receiving anticoagulation therapy.  SEEK IMMEDIATE MEDICAL CARE IF:   Bleeding from the nose or gums does not stop quickly.   You have unusual bruising or are bruising easily.   Swelling or pain occurs at an injection site.   A cut does not stop bleeding within 10 minutes.   You have continual nausea for more than 1 day or are vomiting blood.   You are coughing up blood.   You have blood in the urine.   You have dark or black stools.   You have sudden weakness or numbness of the face, arm, or leg, especially on one side of the body.   You have sudden confusion.   You have trouble speaking (aphasia) or understanding.   You have sudden trouble seeing in one or both eyes.   You have sudden trouble walking.   You have dizziness.   You have a loss of   balance or coordination.   You have severe pain, such as a headache, joint pain, or back pain.   You have a serious fall or head injury, even if you are not bleeding.   You have an oral temperature above 102 F (38.9 C), not controlled by medicine.  ANY OF THESE SYMPTOMS MAY REPRESENT A SERIOUS PROBLEM THAT IS AN EMERGENCY. Do not wait to see if the symptoms will go away. Get medical help right away. Call your local emergency services (911 in [place] DO NOT drive yourself to the hospital.  MAKE SURE YOU:   Understand these instructions.   Will watch your condition.   Will get help right away if you are not doing well or get worse.  Document Released: 02/20/2004 Document Revised: 04/09/2011 Document Reviewed: 04/20/2005  [employer] Patient Information 2012 [employer].

## 2011-07-12 NOTE — ED Provider Notes (Signed)
History     CSN: [id number]  Arrival date & time 07/12/11  1150   First MD Initiated Contact with Patient 07/12/11 1242      Chief Complaint  Patient presents with  . Leg Pain    (Consider location/radiation/quality/duration/timing/severity/associated sxs/prior treatment) Patient is a 31 y.o. female presenting with leg pain. The history is provided by the patient. No language interpreter was used.  Leg Pain  Incident onset: more than 2 weeks ago. Incident location: no injury. There was no injury mechanism. The pain is present in the left leg. The quality of the pain is described as aching and burning. The pain is at a severity of 5/10. The pain is moderate. The pain has been constant since onset. Pertinent negatives include no numbness. She reports no foreign bodies present. She has tried ice and elevation for the symptoms. The treatment provided no relief.  Pt seen here by me 2 days ago.  Pt reports pain is increasing.  (Pt had negative ddimer)    Past Medical History  Diagnosis Date  . GERD (gastroesophageal reflux disease)     Past Surgical History  Procedure Date  . Cesarean section   . Mouth surgery   . Rhinoplasty     Family History  Problem Relation Age of Onset  . Heart disease Mother   . Heart disease Father   . Hypertension Father   . Cancer Paternal Grandmother     Colon cancer  . Diabetes Paternal Grandmother     History  Substance Use Topics  . Smoking status: Former Smoker    Quit date: 12/23/2008  . Smokeless tobacco: Never Used  . Alcohol Use: Yes     socially    OB History    Grav Para Term Preterm Abortions TAB SAB Ect Mult Living   1 1  1      1       Review of Systems  Musculoskeletal: Positive for myalgias. Negative for joint swelling.  Skin: Negative for color change and wound.  Neurological: Negative for numbness.  All other systems reviewed and are negative.    Allergies  Diflucan and Latex  Home Medications   Current  Outpatient Rx  Name Route Sig Dispense Refill  . ACETAMINOPHEN 500 MG PO TABS Oral Take 500 mg by mouth every 6 (six) hours as needed. Patient used this medication for cramps.    . ASPIRIN 81 MG PO TABS Oral Take 81 mg by mouth daily.      Marland Kitchen DIPHENHYDRAMINE HCL 12.5 MG/5ML PO LIQD Oral Take 25 mg by mouth once.    Marland Kitchen LORATADINE 10 MG PO TABS Oral Take 10 mg by mouth daily.    Marland Kitchen OMEPRAZOLE 20 MG PO CPDR Oral Take 20 mg by mouth daily.      BP 117/66  Pulse 81  Temp(Src) 98.4 F (36.9 C) (Oral)  Resp 18  SpO2 100%  LMP 07/08/2011  Physical Exam  Vitals reviewed. Constitutional: She appears well-developed and well-nourished.  HENT:  Head: Normocephalic.  Cardiovascular: Normal rate.   Pulmonary/Chest: Effort normal.  Musculoskeletal: Normal range of motion. She exhibits tenderness. She exhibits no edema.  Neurological: She is alert.  Skin: Skin is warm.  Psychiatric: She has a normal mood and affect.    ED Course  Procedures (including critical care time)  Labs Reviewed - No data to display Dg Tibia/fibula Left  07/12/2011  *RADIOLOGY REPORT*  Clinical Data: Left lower leg pain.  LEFT TIBIA AND FIBULA - 2  VIEW  Comparison: None.  Findings: No evidence of fracture or dislocation.  No bony lesions or erosions are identified.  Soft tissues are unremarkable.  IMPRESSION: Normal left tibia and fibula.  Original Report Authenticated By: Reola Calkins, M.D.     No diagnosis found.    MDM  Xray normal.  Pt concerned about dvt.  I scheduled for doppler study.  I will give pt an injection of lovenox.  Pt scheduled for doppler at 11am tomorrow        Elson Areas, [place] 07/12/11 1428

## 2011-07-12 NOTE — ED Notes (Signed)
Patient refused lovenox and stated that she would wait for the results of the test tomorrow and take the shot if required at that time, MD informed

## 2011-07-12 NOTE — ED Provider Notes (Signed)
Medical screening examination/treatment/procedure(s) were performed by non-physician practitioner and as supervising physician I was immediately available for consultation/collaboration.   Javani Spratt, MD 07/12/11 1531 

## 2011-07-13 ENCOUNTER — Ambulatory Visit (HOSPITAL_BASED_OUTPATIENT_CLINIC_OR_DEPARTMENT_OTHER)
Admit: 2011-07-13 | Discharge: 2011-07-13 | Disposition: A | Discharge status: Home / Self Care | Payer: BC Managed Care – PPO | Attending: Emergency Medicine | Admitting: Emergency Medicine

## 2011-07-13 DIAGNOSIS — M79609 Pain in unspecified limb: Secondary | ICD-10-CM

## 2011-09-11 IMAGING — US US FETAL BPP W/O NONSTRESS
1 series · 14 of 28 positions shown · non-contrast
Comparison: none

OBSTETRICAL ULTRASOUND:
 This ultrasound exam was performed in the [HOSPITAL] Ultrasound Department.  The OB US report was generated in the AS system, and faxed to the ordering physician.  This report is also available in [HOSPITAL]?s [employer] and in [REDACTED] PACS.

[Series 1: us fetal bpp w/o nonstress · non-contrast · 0.22mm/px · 30 acquisitions, 14 frames shown]
[im 2/30]
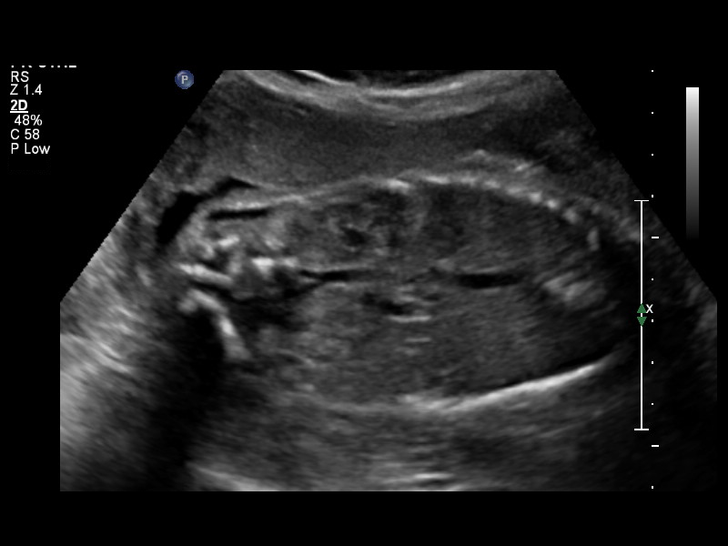
[im 4/30]
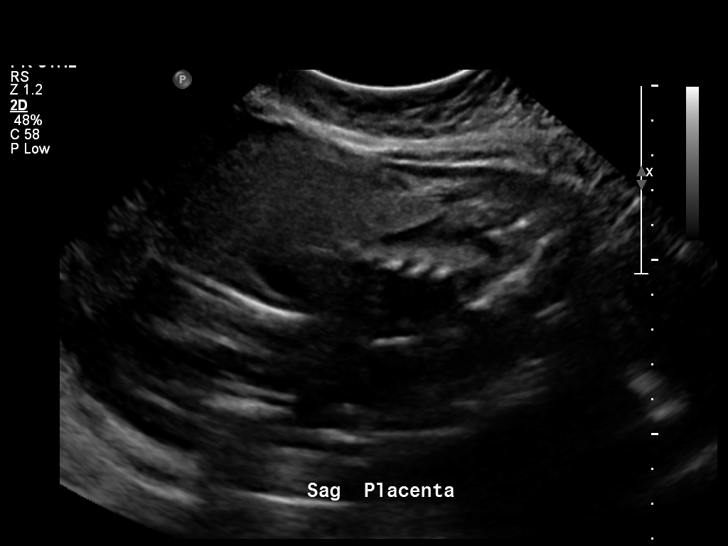
[im 6/30]
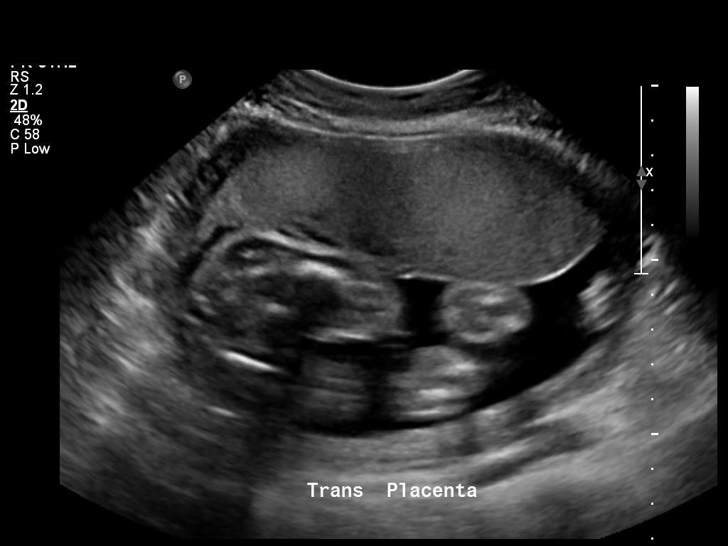
[im 8/30]
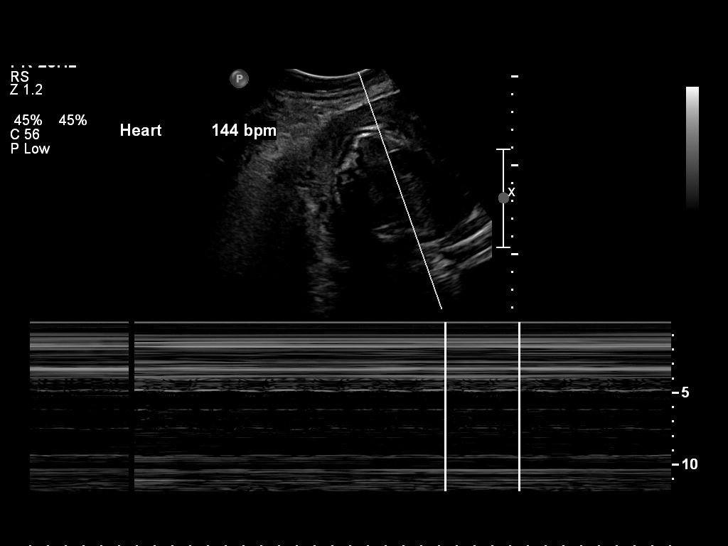
[im 10/30]
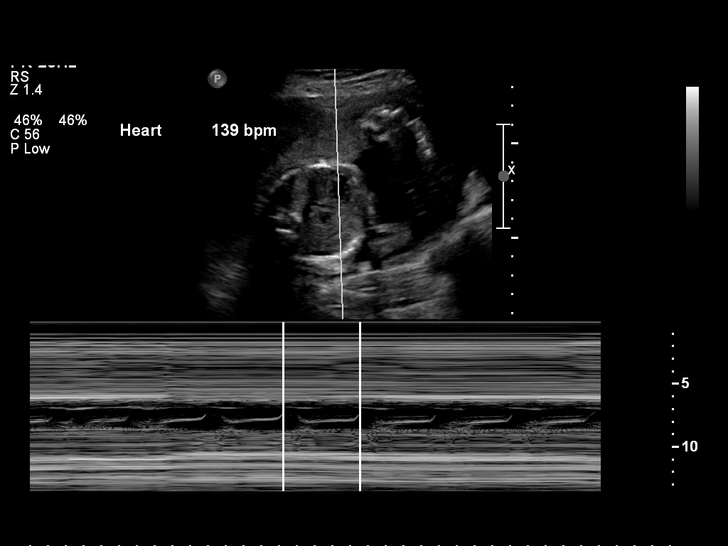
[im 12/30]
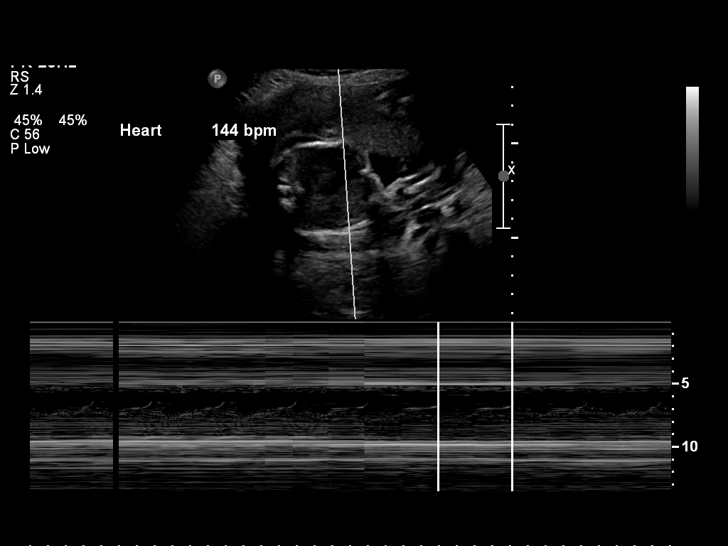
[im 14/30]
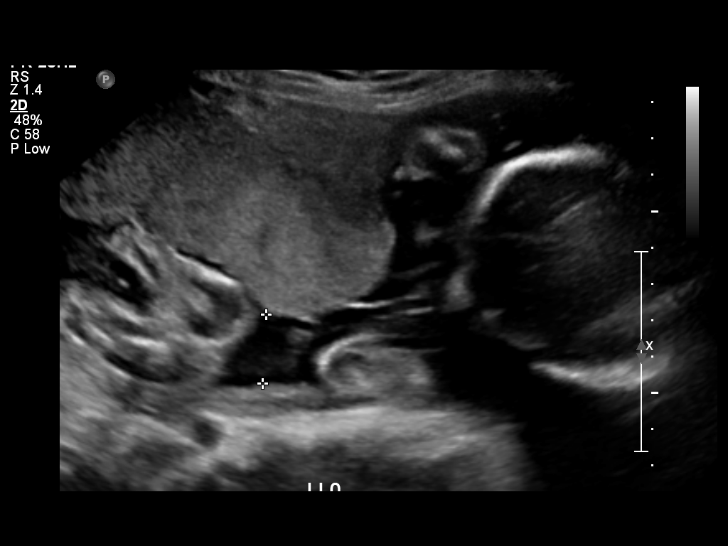
[im 17/30]
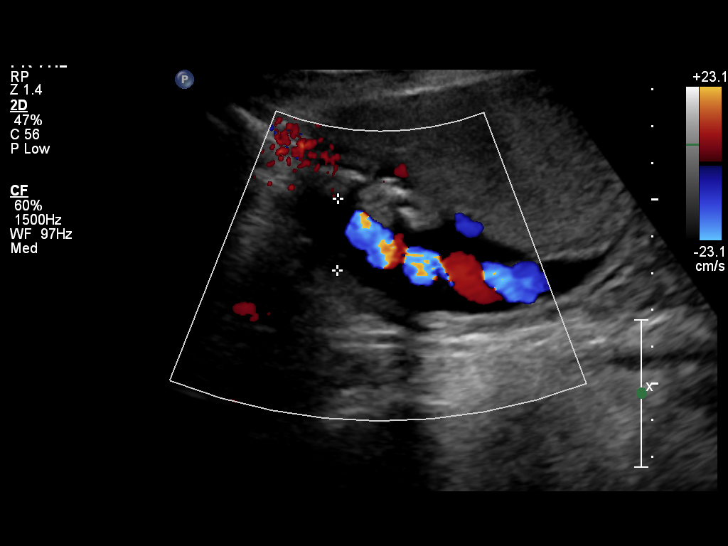
[im 19/30]
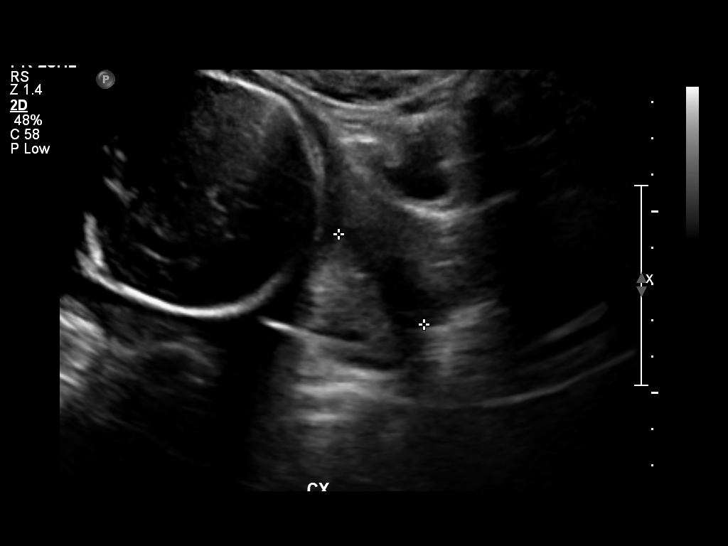
[im 21/30]
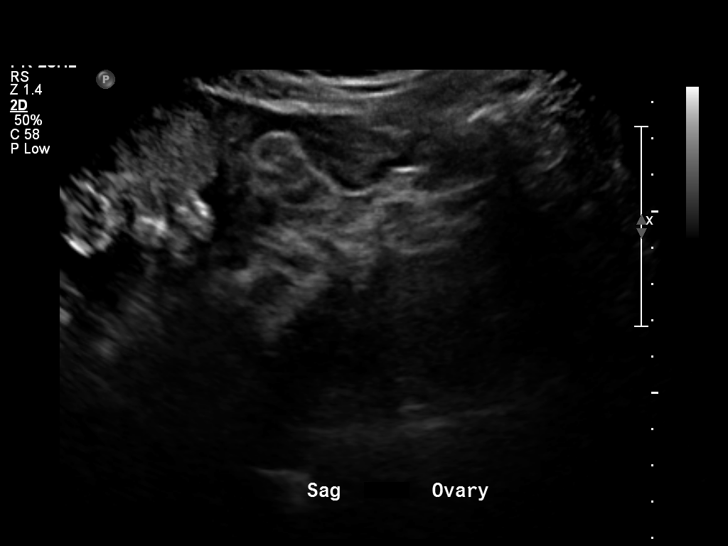
[im 23/30]
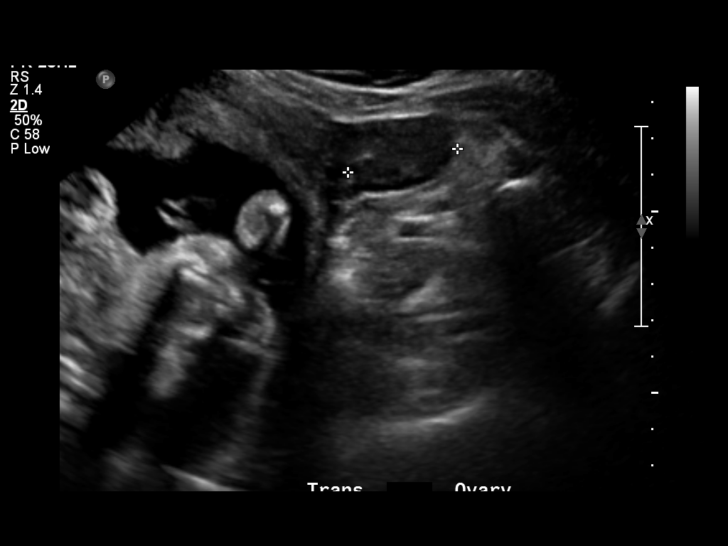
[im 25/30]
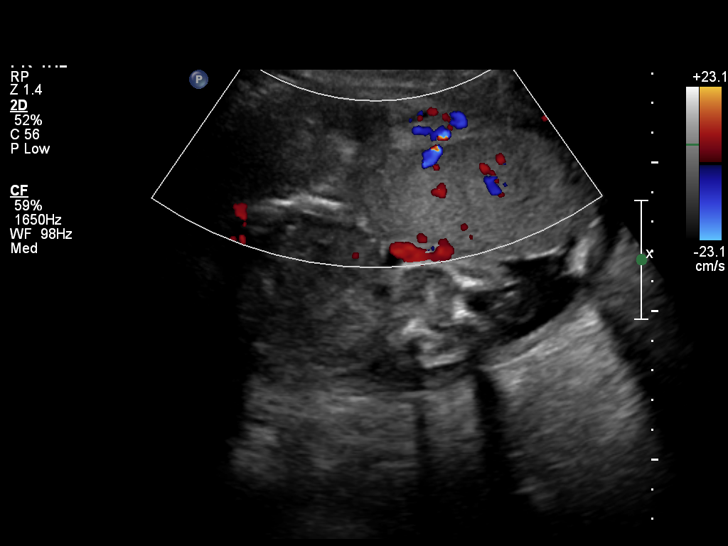
[im 27/30]
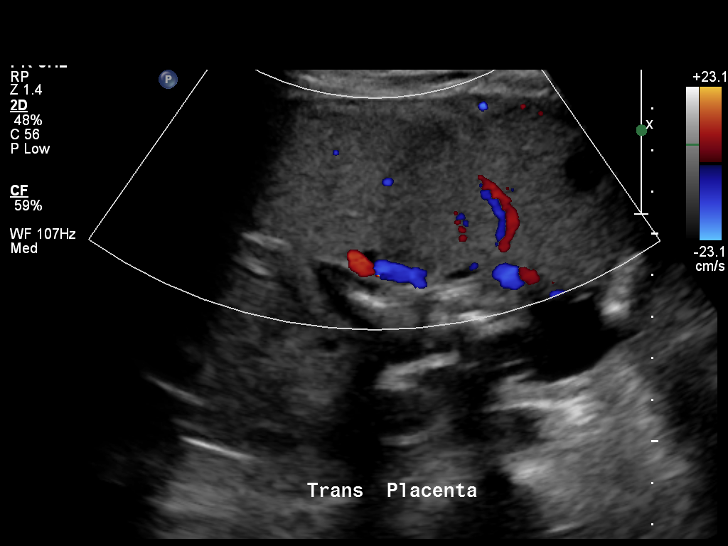
[im 30/30]
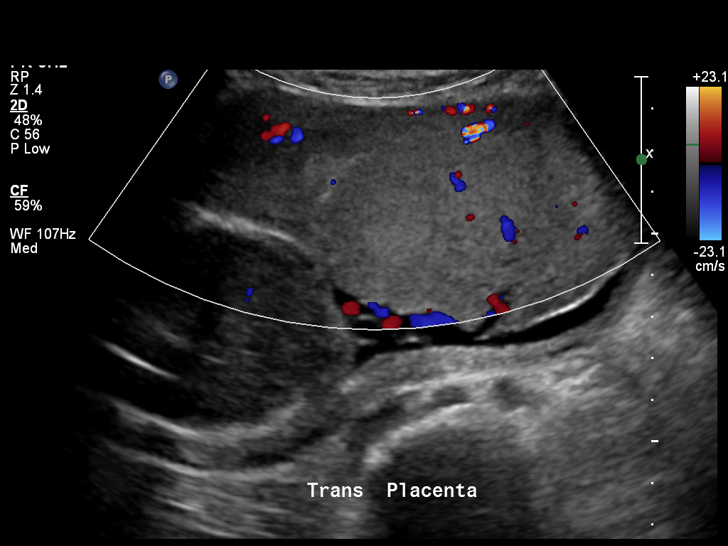

[14 of 28 positions shown; findings below may reference images not displayed]

IMPRESSION: See AS Obstetric US report.

## 2011-10-01 ENCOUNTER — Encounter: Payer: Self-pay | Admitting: Gynecology

## 2011-10-01 ENCOUNTER — Ambulatory Visit (INDEPENDENT_AMBULATORY_CARE_PROVIDER_SITE_OTHER): Payer: BC Managed Care – PPO | Admitting: Gynecology

## 2011-10-01 DIAGNOSIS — N644 Mastodynia: Secondary | ICD-10-CM

## 2011-10-01 LAB — PROLACTIN: Prolactin: 7.2 ng/mL

## 2011-10-01 MED ORDER — IBUPROFEN 800 MG PO TABS: 800.0000 mg | ORAL_TABLET | Freq: Three times a day (TID) | ORAL | Status: AC | PRN

## 2011-10-01 NOTE — Patient Instructions (Signed)
Take ibuprofen as prescribed 3 times daily for one to 2 weeks. Apply heat to the left breast at night. Follow up if symptoms persist for further evaluation.

## 2011-10-01 NOTE — Progress Notes (Signed)
Patient presents with 2 month history of left breast discomfort. Patient feels aching to sharp stabbing pains from the nipple back into the breast tissue. This occurs on a daily basis and does not seem to be related to her menses. She was evaluated approximately a year ago for left breast lump with mammogram and ultrasound which were normal and it was felt that this was a lymph node that resolved and she has not felt any lumps but just this discomfort over the past 2 months. She's had no galactorrhea or any other nipple discharge. No precipitating event such as trauma or new exercise programs. Also of note she has had several CT scans this past year for follow up of a lung nodule which is stable but they also had looked at her chest wall which was normal.  Exam sherri chaparone present. Both breast examined lying and sitting without masses retractions discharge adenopathy.  Assessment and plan: Left breast mastalgia 2 month history. Mammography/ultrasound one year ago normal. No galactorrhea or other findings on physical exam. We'll check baseline prolactin. Will apply heat to the left breast and start ibuprofen 800 mg 3 times a day for one to 2 weeks. If symptoms persist and she'll call me and we'll pursue more involved evaluation. If it improves and resolves then we'll follow expectantly. Patient's comfortable with this plan.

## 2011-10-08 ENCOUNTER — Encounter: Payer: BC Managed Care – PPO | Admitting: Gynecology

## 2011-10-29 ENCOUNTER — Observation Stay (HOSPITAL_COMMUNITY): Payer: BC Managed Care – PPO

## 2011-10-29 ENCOUNTER — Emergency Department (HOSPITAL_BASED_OUTPATIENT_CLINIC_OR_DEPARTMENT_OTHER): Payer: BC Managed Care – PPO

## 2011-10-29 ENCOUNTER — Encounter (HOSPITAL_BASED_OUTPATIENT_CLINIC_OR_DEPARTMENT_OTHER): Payer: Self-pay | Admitting: *Deleted

## 2011-10-29 ENCOUNTER — Observation Stay (HOSPITAL_BASED_OUTPATIENT_CLINIC_OR_DEPARTMENT_OTHER)
Admission: EM | Admit: 2011-10-29 | Discharge: 2011-10-29 | Disposition: A | Discharge status: Home / Self Care | Payer: BC Managed Care – PPO | Attending: Emergency Medicine | Admitting: Emergency Medicine

## 2011-10-29 DIAGNOSIS — G459 Transient cerebral ischemic attack, unspecified: Secondary | ICD-10-CM

## 2011-10-29 DIAGNOSIS — R202 Paresthesia of skin: Secondary | ICD-10-CM

## 2011-10-29 DIAGNOSIS — R200 Anesthesia of skin: Secondary | ICD-10-CM

## 2011-10-29 DIAGNOSIS — R209 Unspecified disturbances of skin sensation: Principal | ICD-10-CM | POA: Insufficient documentation

## 2011-10-29 LAB — BASIC METABOLIC PANEL
BUN: 16 mg/dL (ref 6–23)
CO2: 23 mEq/L (ref 19–32)
Chloride: 106 mEq/L (ref 96–112)
Creatinine, Ser: 0.8 mg/dL (ref 0.50–1.10)
GFR calc Af Amer: >90 mL/min (ref >90)
Potassium: 3.7 mEq/L (ref 3.5–5.1)

## 2011-10-29 LAB — CBC WITH DIFFERENTIAL/PLATELET
HCT: 39.8 % (ref 36.0–46.0)
Hemoglobin: 13.6 g/dL (ref 12.0–15.0)
Lymphocytes Relative: 39 % (ref 12–46)
Lymphs Abs: 3.1 10*3/uL (ref 0.7–4.0)
MCHC: 34.2 g/dL (ref 30.0–36.0)
Monocytes Absolute: 0.7 10*3/uL (ref 0.1–1.0)
Monocytes Relative: 9 % (ref 3–12)
Neutro Abs: 3.9 10*3/uL (ref 1.7–7.7)
Neutrophils Relative %: 48 % (ref 43–77)
RBC: 4.19 MIL/uL (ref 3.87–5.11)
WBC: 8.1 10*3/uL (ref 4.0–10.5)

## 2011-10-29 LAB — URINALYSIS, ROUTINE W REFLEX MICROSCOPIC
Bilirubin Urine: NEGATIVE
Glucose, UA: NEGATIVE mg/dL
Ketones, ur: 15 mg/dL — AB
Leukocytes, UA: NEGATIVE
Nitrite: NEGATIVE
Specific Gravity, Urine: 1.03 (ref 1.005–1.030)
pH: 5 (ref 5.0–8.0)

## 2011-10-29 LAB — LIPID PANEL
LDL Cholesterol: 99 mg/dL (ref 0–99)
Total CHOL/HDL Ratio: 3 RATIO
Triglycerides: 127 mg/dL (ref <150)
VLDL: 25 mg/dL (ref 0–40)

## 2011-10-29 LAB — URINE MICROSCOPIC-ADD ON

## 2011-10-29 LAB — HEMOGLOBIN A1C: Mean Plasma Glucose: 100 mg/dL (ref <117)

## 2011-10-29 MED ORDER — LORAZEPAM 1 MG PO TABS
1.0000 mg | ORAL_TABLET | Freq: Once | ORAL | Status: AC
  Administered 2011-10-29: 1 mg via ORAL
  Filled 2011-10-29: qty 1

## 2011-10-29 MED ORDER — ASPIRIN 325 MG PO TABS
325.0000 mg | ORAL_TABLET | Freq: Once | ORAL | Status: AC
  Administered 2011-10-29: 325 mg via ORAL

## 2011-10-29 MED ORDER — ASPIRIN 325 MG PO TABS
ORAL_TABLET | ORAL | Status: AC
  Administered 2011-10-29: 325 mg via ORAL
  Filled 2011-10-29: qty 1

## 2011-10-29 NOTE — ED Provider Notes (Signed)
9:15 AM Assumed care of patient in the CDU.  Patient is on TIA protocol.  Patient was sent to the CDU from [facility].  She was seen at [facility] by Dr. Nicanor Alcon.  Patient presented this morning with a chief complaint of LLE numbness and numbness of the left tongue.  Patient had a normal head CT and normal EKG.  Labs unremarkable.  Patient is currently awaiting MRI and MRA of brain.  Patient is also going to have Carotid Dopplers and a 2D Echocardiogram this morning.  Reassessed patient.  She reports that she is still having some numbness of the left side of her tongue.  No LLE numbness at this time.  Patient alert and orientated x 3, Heart RRR, Lungs CTAB, CN II-XII grossly intact, grip strength 5/5 bilaterally, formal finger to nose testing, normal Rapid alternating movements, muscle strength 5/5 bilaterally, distal sensation intact.  11:59 AM MRI and MRA both normal.  Normal carotid dopplers and normal 2D Echocardiogram.  Therefore, will discharge patient home.  Patient in agreement with the plan.  Return precautions discussed.  Pascal Lux Chipley, PA-C 10/29/11 1224

## 2011-10-29 NOTE — Progress Notes (Signed)
Bilateral carotid artery duplex completed.  Preliminary report is no evidence of significant ICA stenosis. 

## 2011-10-29 NOTE — Progress Notes (Signed)
  Echocardiogram 2D Echocardiogram has been performed.  Carrie Morrison 10/29/2011, 9:53 AM

## 2011-10-29 NOTE — ED Notes (Signed)
Ativan ordered for pt due to being claustrophobic. MRI will be  Coming to get pt in 1 hr for study.

## 2011-10-29 NOTE — ED Notes (Signed)
Pt transported to Vascular Lab

## 2011-10-29 NOTE — ED Notes (Signed)
Patient transported to MRI 

## 2011-10-29 NOTE — ED Provider Notes (Signed)
Medical screening examination/treatment/procedure(s) were performed by non-physician practitioner and as supervising physician I was immediately available for consultation/collaboration.   Glynn Octave, MD 10/29/11 [phone]

## 2011-10-29 NOTE — ED Notes (Signed)
Pt provided stroke and blood pressure booklets.

## 2011-10-29 NOTE — Discharge Instructions (Signed)
There were not any abnormalities on the MRI and MRA of your brain.  Your labwork looked good.  Also the carotid doppler and the Echocardiogram of your heart was normal.

## 2011-10-29 NOTE — ED Notes (Signed)
Pt reports she woke up at 0315 and the left side of her tongue was numb- grips equal, facial symmetry present, speech clear

## 2011-10-29 NOTE — ED Notes (Signed)
Patient transported to CT 

## 2011-10-29 NOTE — ED Provider Notes (Addendum)
History     CSN: [id number]  Arrival date & time 10/29/11  0508   First MD Initiated Contact with Patient 10/29/11 [phone]      Chief Complaint  Patient presents with  . Numbness    (Consider location/radiation/quality/duration/timing/severity/associated sxs/prior treatment) The history is provided by the patient.  Went to bed at 10-11 pm and awoke with left tongue and LLE numbness.  No weakness.  No speech or visual difficulty. No facial assymetry.  No Cp, no SOB no n/v/d.  No antecedent illness  Past Medical History  Diagnosis Date  . GERD (gastroesophageal reflux disease)     Past Surgical History  Procedure Date  . Cesarean section   . Mouth surgery   . Rhinoplasty     Family History  Problem Relation Age of Onset  . Heart disease Mother   . Heart disease Father   . Hypertension Father   . Cancer Paternal Grandmother     Colon cancer  . Diabetes Paternal Grandmother     History  Substance Use Topics  . Smoking status: Former Smoker    Quit date: 12/23/2008  . Smokeless tobacco: Never Used  . Alcohol Use: 4.2 oz/week    7 Glasses of wine per week    OB History    Grav Para Term Preterm Abortions TAB SAB Ect Mult Living   1 1  1      1       Review of Systems  Neurological: Positive for numbness. Negative for facial asymmetry, speech difficulty, weakness and headaches.  All other systems reviewed and are negative.    Allergies  Fluconazole in dextrose and Latex  Home Medications   Current Outpatient Rx  Name Route Sig Dispense Refill  . ACETAMINOPHEN 500 MG PO TABS Oral Take 500 mg by mouth every 6 (six) hours as needed. Patient used this medication for cramps.    . ASPIRIN 81 MG PO TABS Oral Take 81 mg by mouth daily.      Marland Kitchen DIPHENHYDRAMINE HCL 12.5 MG/5ML PO LIQD Oral Take 25 mg by mouth once.    Marland Kitchen LORATADINE 10 MG PO TABS Oral Take 10 mg by mouth daily.    Marland Kitchen OMEPRAZOLE 20 MG PO CPDR Oral Take 20 mg by mouth daily.      BP 113/65  Pulse 75   Temp 98 F (36.7 C) (Oral)  Resp 20  Ht 5\' 2"  (1.575 m)  Wt 138 lb (62.596 kg)  BMI 25.24 kg/m2  SpO2 100%  LMP 10/09/2011  Physical Exam  Constitutional: She is oriented to person, place, and time. She appears well-developed and well-nourished.  HENT:  Head: Normocephalic and atraumatic.  Mouth/Throat: Oropharynx is clear and moist.  Eyes: Conjunctivae are normal. Pupils are equal, round, and reactive to light.  Neck: Normal range of motion. Neck supple.  Cardiovascular: Normal rate and regular rhythm.   Pulmonary/Chest: Effort normal and breath sounds normal.  Abdominal: Soft. Bowel sounds are normal. There is no tenderness. There is no rebound and no guarding.  Musculoskeletal: Normal range of motion.  Neurological: She is alert and oriented to person, place, and time. She has normal reflexes. She displays normal reflexes. No cranial nerve deficit.       States diminished sensation on LLE exam  Skin: Skin is warm and dry.  Psychiatric: She has a normal mood and affect.    ED Course  Procedures (including critical care time)  Labs Reviewed  URINALYSIS, ROUTINE W REFLEX MICROSCOPIC - Abnormal;  Notable for the following:    APPearance CLOUDY (*)     Hgb urine dipstick SMALL (*)     Ketones, ur 15 (*)     All other components within normal limits  URINE MICROSCOPIC-ADD ON - Abnormal; Notable for the following:    Squamous Epithelial / LPF FEW (*)     Bacteria, UA FEW (*)     Crystals CA OXALATE CRYSTALS (*)     All other components within normal limits  CBC WITH DIFFERENTIAL  PREGNANCY, URINE  BASIC METABOLIC PANEL   No results found.   No diagnosis found.    MDM  Place on CDU TIA protocol, if MRI of brain negative follow up with PMD. Patient verbalizes understanding and agrees to follow up   Date: 10/29/2011  Rate: 72  Rhythm: normal sinus rhythm  QRS Axis: normal  Intervals: normal  ST/T Wave abnormalities: normal  Conduction Disutrbances: none  Narrative  Interpretation: unremarkable       Philippe Gang K Gladiola Madore-Rasch, MD 10/29/11 0612  Armenta Erskin K Nikeisha Klutz-Rasch, MD 10/29/11 [phone]

## 2012-03-21 ENCOUNTER — Encounter: Payer: BC Managed Care – PPO | Admitting: Gynecology

## 2012-03-23 ENCOUNTER — Ambulatory Visit (INDEPENDENT_AMBULATORY_CARE_PROVIDER_SITE_OTHER): Payer: BC Managed Care – PPO | Admitting: Women's Health

## 2012-03-23 ENCOUNTER — Encounter: Payer: Self-pay | Admitting: Women's Health

## 2012-03-23 ENCOUNTER — Other Ambulatory Visit: Payer: Self-pay | Admitting: Gynecology

## 2012-03-23 ENCOUNTER — Ambulatory Visit (INDEPENDENT_AMBULATORY_CARE_PROVIDER_SITE_OTHER): Payer: BC Managed Care – PPO

## 2012-03-23 DIAGNOSIS — R102 Pelvic and perineal pain: Secondary | ICD-10-CM

## 2012-03-23 DIAGNOSIS — N8300 Follicular cyst of ovary, unspecified side: Secondary | ICD-10-CM

## 2012-03-23 DIAGNOSIS — R1031 Right lower quadrant pain: Secondary | ICD-10-CM

## 2012-03-23 DIAGNOSIS — N83201 Unspecified ovarian cyst, right side: Secondary | ICD-10-CM

## 2012-03-23 DIAGNOSIS — N831 Corpus luteum cyst of ovary, unspecified side: Secondary | ICD-10-CM

## 2012-03-23 DIAGNOSIS — N83209 Unspecified ovarian cyst, unspecified side: Secondary | ICD-10-CM

## 2012-03-23 DIAGNOSIS — N898 Other specified noninflammatory disorders of vagina: Secondary | ICD-10-CM

## 2012-03-23 LAB — WET PREP FOR TRICH, YEAST, CLUE: Yeast Wet Prep HPF POC: NONE SEEN

## 2012-03-23 MED ORDER — METRONIDAZOLE 0.75 % VA GEL: VAGINAL | Status: DC

## 2012-03-23 NOTE — Patient Instructions (Addendum)
Ovarian Cyst The ovaries are small organs that are on each side of the uterus. The ovaries are the organs that produce the female hormones, estrogen and progesterone. An ovarian cyst is a sac filled with fluid that can vary in its size. It is normal for a small cyst to form in women who are in the childbearing age and who have menstrual periods. This type of cyst is called a follicle cyst that becomes an ovulation cyst (corpus luteum cyst) after it produces the women's egg. It later goes away on its own if the woman does not become pregnant. There are other kinds of ovarian cysts that may cause problems and may need to be treated. The most serious problem is a cyst with cancer. It should be noted that menopausal women who have an ovarian cyst are at a higher risk of it being a cancer cyst. They should be evaluated very quickly, thoroughly and followed closely. This is especially true in menopausal women because of the high rate of ovarian cancer in women in menopause. CAUSES AND TYPES OF OVARIAN CYSTS:  FUNCTIONAL CYST: The follicle/corpus luteum cyst is a functional cyst that occurs every month during ovulation with the menstrual cycle. They go away with the next menstrual cycle if the woman does not get pregnant. Usually, there are no symptoms with a functional cyst.  ENDOMETRIOMA CYST: This cyst develops from the lining of the uterus tissue. This cyst gets in or on the ovary. It grows every month from the bleeding during the menstrual period. It is also called a "chocolate cyst" because it becomes filled with blood that turns brown. This cyst can cause pain in the lower abdomen during intercourse and with your menstrual period.  CYSTADENOMA CYST: This cyst develops from the cells on the outside of the ovary. They usually are not cancerous. They can get very big and cause lower abdomen pain and pain with intercourse. This type of cyst can twist on itself, cut off its blood supply and cause severe pain. It  also can easily rupture and cause a lot of pain.  DERMOID CYST: This type of cyst is sometimes found in both ovaries. They are found to have different kinds of body tissue in the cyst. The tissue includes skin, teeth, hair, and/or cartilage. They usually do not have symptoms unless they get very big. Dermoid cysts are rarely cancerous.  POLYCYSTIC OVARY: This is a rare condition with hormone problems that produces many small cysts on both ovaries. The cysts are follicle-like cysts that never produce an egg and become a corpus luteum. It can cause an increase in body weight, infertility, acne, increase in body and facial hair and lack of menstrual periods or rare menstrual periods. Many women with this problem develop type 2 diabetes. The exact cause of this problem is unknown. A polycystic ovary is rarely cancerous.  THECA LUTEIN CYST: Occurs when too much hormone (human chorionic gonadotropin) is produced and over-stimulates the ovaries to produce an egg. They are frequently seen when doctors stimulate the ovaries for invitro-fertilization (test tube babies).  LUTEOMA CYST: This cyst is seen during pregnancy. Rarely it can cause an obstruction to the birth canal during labor and delivery. They usually go away after delivery. SYMPTOMS   Pelvic pain or pressure.  Pain during sexual intercourse.  Increasing girth (swelling) of the abdomen.  Abnormal menstrual periods.  Increasing pain with menstrual periods.  You stop having menstrual periods and you are not pregnant. DIAGNOSIS  The diagnosis can   be made during:  Routine or annual pelvic examination (common).  Ultrasound.  X-ray of the pelvis.  CT Scan.  MRI.  Blood tests. TREATMENT   Treatment may only be to follow the cyst monthly for 2 to 3 months with your caregiver. Many go away on their own, especially functional cysts.  May be aspirated (drained) with a long needle with ultrasound, or by laparoscopy (inserting a tube into  the pelvis through a small incision).  The whole cyst can be removed by laparoscopy.  Sometimes the cyst may need to be removed through an incision in the lower abdomen.  Hormone treatment is sometimes used to help dissolve certain cysts.  Birth control pills are sometimes used to help dissolve certain cysts. HOME CARE INSTRUCTIONS  Follow your caregiver's advice regarding:  Medicine.  Follow up visits to evaluate and treat the cyst.  You may need to come back or make an appointment with another caregiver, to find the exact cause of your cyst, if your caregiver is not a gynecologist.  Get your yearly and recommended pelvic examinations and Pap tests.  Let your caregiver know if you have had an ovarian cyst in the past. SEEK MEDICAL CARE IF:   Your periods are late, irregular, they stop, or are painful.  Your stomach (abdomen) or pelvic pain does not go away.  Your stomach becomes larger or swollen.  You have pressure on your bladder or trouble emptying your bladder completely.  You have painful sexual intercourse.  You have feelings of fullness, pressure, or discomfort in your stomach.  You lose weight for no apparent reason.  You feel generally ill.  You become constipated.  You lose your appetite.  You develop acne.  You have an increase in body and facial hair.  You are gaining weight, without changing your exercise and eating habits.  You think you are pregnant. SEEK IMMEDIATE MEDICAL CARE IF:   You have increasing abdominal pain.  You feel sick to your stomach (nausea) and/or vomit.  You develop a fever that comes on suddenly.  You develop abdominal pain during a bowel movement.  Your menstrual periods become heavier than usual. Document Released: 04/20/2005 Document Revised: 07/13/2011 Document Reviewed: 02/21/2009 Exit[employer] Patient Information 2013 [employer].  

## 2012-03-23 NOTE — Progress Notes (Signed)
Patient ID: PAYSLI STANFILL, female   DOB: May 23, 1980, 31 y.o.   MRN: [id number] Presents with lower right quadrant pain for several days with slight nausea and occasional increased discharge. Pain was worse yesterday, less today. Using withdraw.  Denies UTI symptoms, fever or changes with bowel elimination. Was seen yesterday at  urgent care, had a negative urine and negative U PT. Daughter 2, doing well, premature with severe IUGR.  Exam: Abdomen soft, no rebound or radiation at pain, pain increased with pressure in suprapubic area. External genitalia within normal limits, speculum exam scant white adherent discharge, no odor noted wet prep positive for clue cells and TNTC bacteria. Bimanual no CMT or adnexal fullness with minimal tenderness.  Ultrasound: Anteverted uterus. Right ovary thickwalled irregular shaped cystic mass 24 x 23 x 19 mm mean 22 mm. Left ovary normal. Moderate amount of fluid seen in cul-de-sac.  Right ovarian cyst 24 x 23 x 19 mm  BV  Plan: Reviewed ultrasound findings of probable ruptured right ovarian cyst. Since pain is decreasing will watch at this time. Has Gyn annual exam Monday. Motrin as needed for pain. MetroGel vaginal cream 1 applicator at bedtime x5, prescription proper use given and reviewed alcohol precautions.

## 2012-03-28 ENCOUNTER — Other Ambulatory Visit (HOSPITAL_COMMUNITY)
Admission: RE | Admit: 2012-03-28 | Discharge: 2012-03-28 | Disposition: A | Discharge status: Home / Self Care | Payer: BC Managed Care – PPO | Source: Ambulatory Visit | Attending: Gynecology | Admitting: Gynecology

## 2012-03-28 ENCOUNTER — Ambulatory Visit (INDEPENDENT_AMBULATORY_CARE_PROVIDER_SITE_OTHER): Payer: BC Managed Care – PPO | Admitting: Gynecology

## 2012-03-28 ENCOUNTER — Encounter: Payer: Self-pay | Admitting: Gynecology

## 2012-03-28 VITALS — BP 108/70 | Ht 62.0 in | Wt 136.0 lb

## 2012-03-28 DIAGNOSIS — N644 Mastodynia: Secondary | ICD-10-CM

## 2012-03-28 DIAGNOSIS — Z1151 Encounter for screening for human papillomavirus (HPV): Secondary | ICD-10-CM | POA: Insufficient documentation

## 2012-03-28 DIAGNOSIS — Z01419 Encounter for gynecological examination (general) (routine) without abnormal findings: Secondary | ICD-10-CM | POA: Insufficient documentation

## 2012-03-28 DIAGNOSIS — Z309 Encounter for contraceptive management, unspecified: Secondary | ICD-10-CM

## 2012-03-28 NOTE — Patient Instructions (Signed)
Follow up in one year for annual exam. Follow up sooner for contraceptive discussion if wants to do something different than condoms.

## 2012-03-28 NOTE — Progress Notes (Signed)
Carrie Morrison 04/27/1981 [id number]        31 y.o.  G1P0101 for annual exam.    Past medical history,surgical history, medications, allergies, family history and social history were all reviewed and documented in the EPIC chart. ROS:  Was performed and pertinent positives and negatives are included in the history.  Exam: Fleet Contras assistant Filed Vitals:   03/28/12 1527  BP: 108/70  Height: 5\' 2"  (1.575 m)  Weight: 136 lb (61.689 kg)   General appearance  Normal Skin grossly normal Head/Neck normal with no cervical or supraclavicular adenopathy thyroid normal Lungs  clear Cardiac RR, without RMG Abdominal  soft, nontender, without masses, organomegaly or hernia Breasts  examined lying and sitting without masses, retractions, discharge or axillary adenopathy. Pelvic  Ext/BUS/vagina  normal   Cervix  normal Pap/HPV  Uterus  anteverted, normal size, shape and contour, midline and mobile nontender   Adnexa  Without masses or tenderness    Anus and perineum  normal   Rectovaginal  normal sphincter tone without palpated masses or tenderness.    Assessment/Plan:  31 y.o. G69P0101 female for annual exam, regular menses, condom birth control.   1. Contraceptive management. Reviewed birth control options with her to include combination hormonal Implanon IUD. She currently using condoms and is comfortable with this and wants to continue. Understands and accepts failure risks. 2. Left breast mastalgia. Present for over a year. Had mammogram ultrasound previously for same complaint 07/2010 which was negative. She has no masses or other abnormalities on self-exam and physician exam. Recommend continued SBE as long as no palpable changes then we'll follow expectantly. 3. Pap smear. Pap/HPV done. No history of abnormal Pap smears previously. 4. History of preterm birth. I did recommend before pursuing pregnancy she may want to make an appointment to see an obstetrician to review her obstetrical  history of preterm birth previously to make sure there is nothing different that they want to do or treat.  Otherwise to follow up early in the next pregnancy to allow for that evaluation. 5. Recent evaluation for ovarian cyst. Patient notes her pain has resolved and she'll continue to monitor. The as long as it remains gone then we will follow. 6. Health maintenance. Recently had CBC and comprehensive panel done with normal glucose. Does have a history of elevated lipid profile previously. Recommended she follow up with her primary to have this lab work done so that they had the opportunity to continue to follow her. Follow up here in one year, sooner as needed.   Dara Lords MD, 4:53 PM 03/28/2012

## 2012-03-30 IMAGING — CT CT CHEST W/ CM
2 of 3 series · 15 of 36 positions shown, 18 images · IV contrast (agent unspecified)
Comparison: None.

CLINICAL DATA: Chest pain radiating to back.  Shortness of breath.

CT CHEST WITH CONTRAST
TECHNIQUE: Multidetector CT imaging of the chest was performed
following the standard protocol during bolus administration of
intravenous contrast.
Contrast: 80 ml Ymnipaque-PLL

[Series 2: chest with st · axial · 0.63mm/px · z∈[-292,-52]mm · 12 of 58 slices shown, 15 images]
[im 5/58  mediastinal]
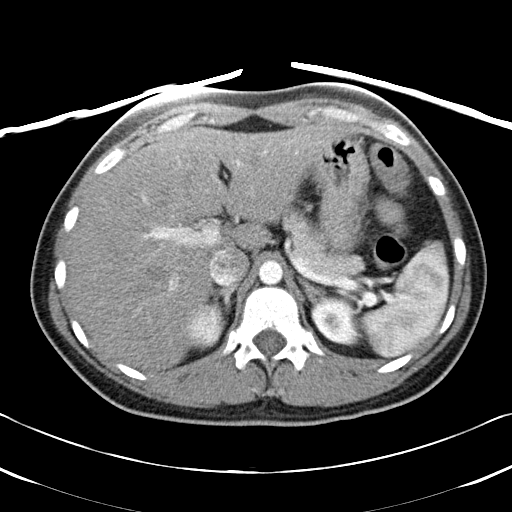
[im 5/58  lung]
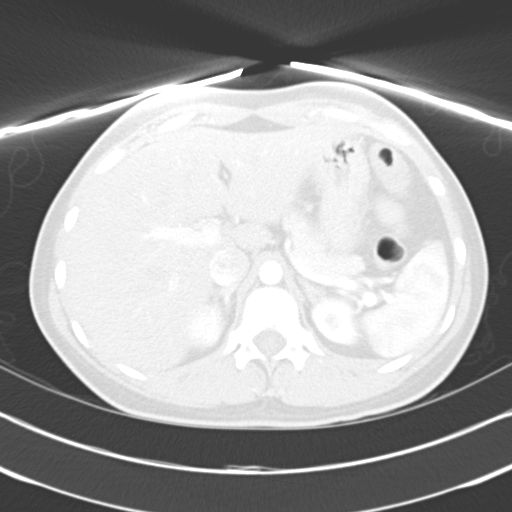
[im 9/58  lung]
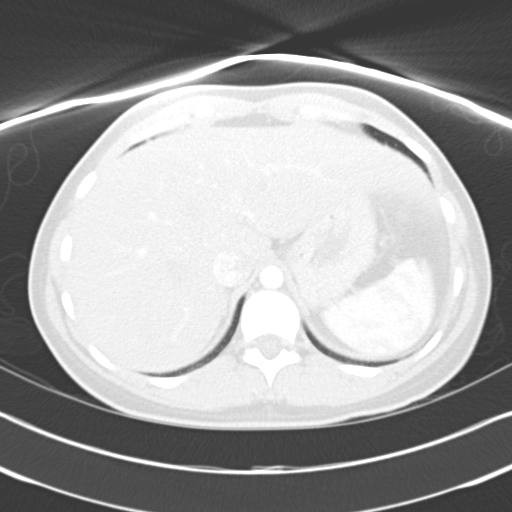
[im 13/58  lung]
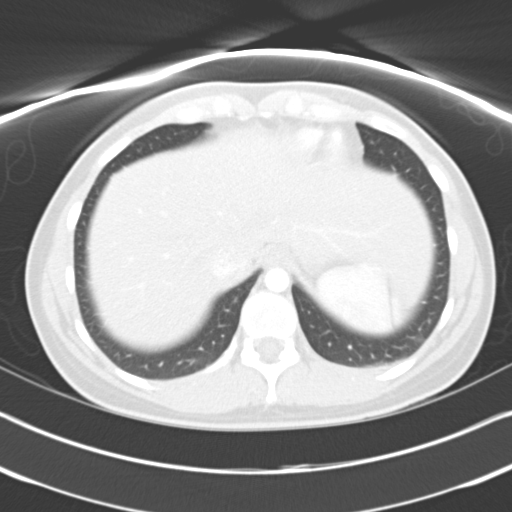
[im 17/58  lung]
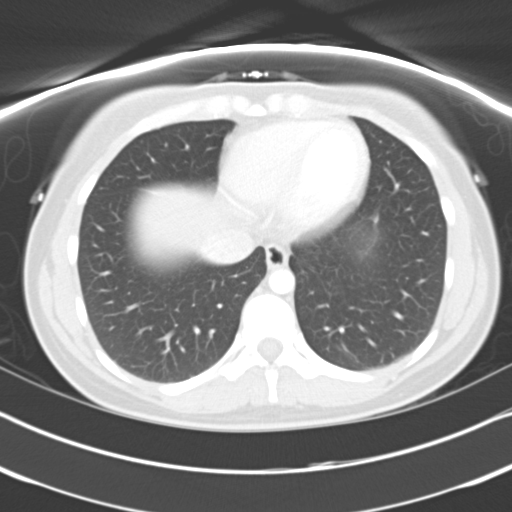
[im 22/58  mediastinal]
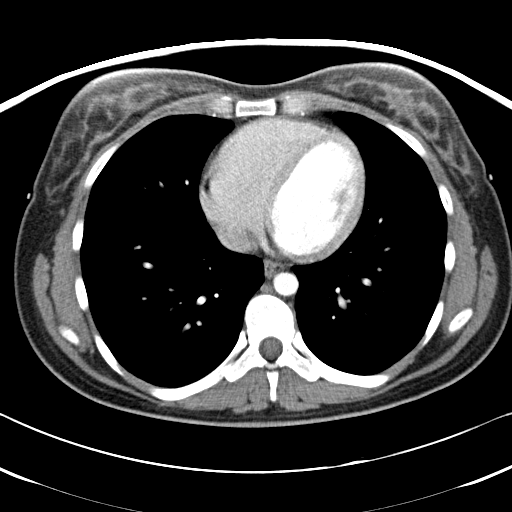
[im 22/58  lung]
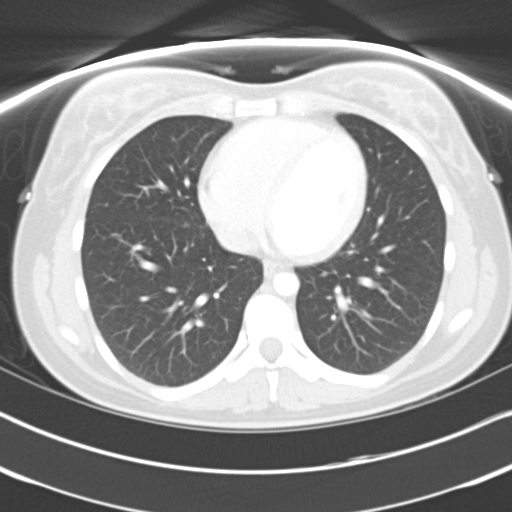
[im 26/58  lung]
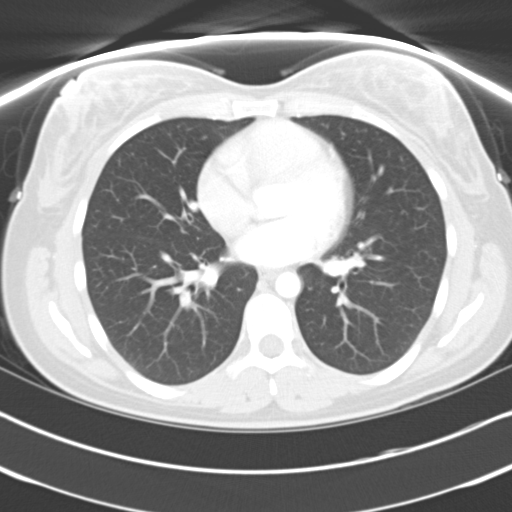
[im 32/58  lung]
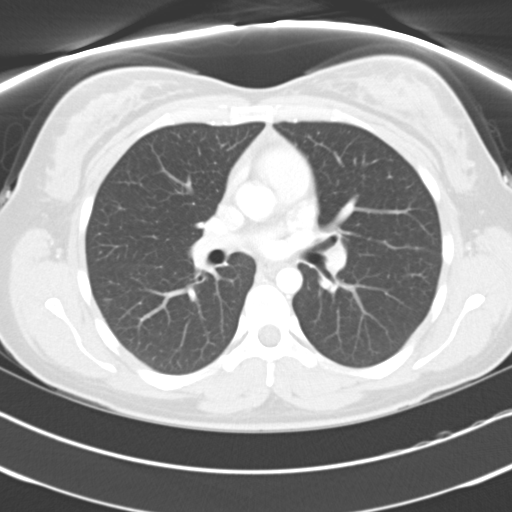
[im 36/58  lung]
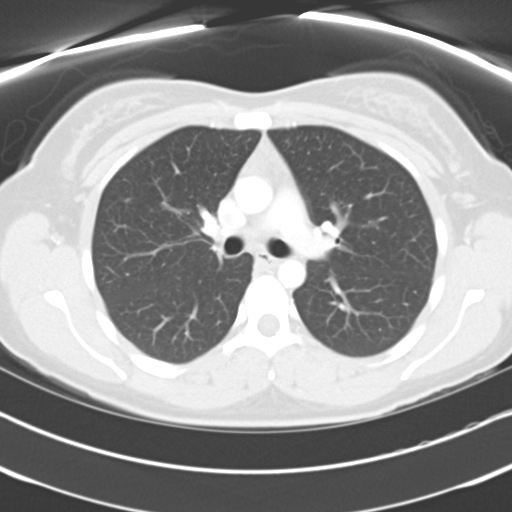
[im 41/58  mediastinal]
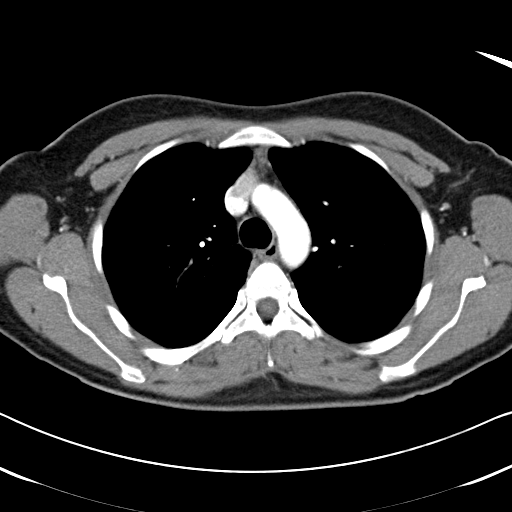
[im 41/58  lung]
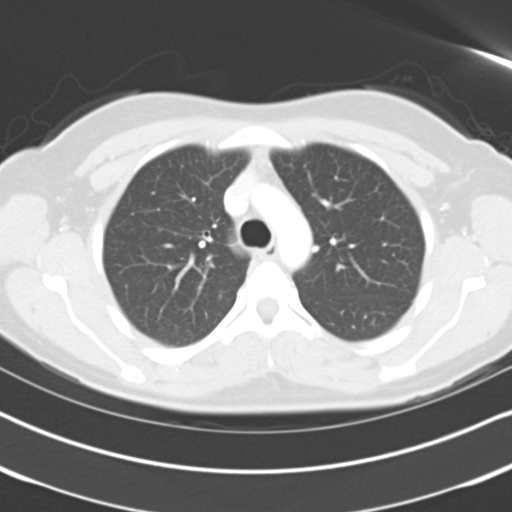
[im 45/58  lung]
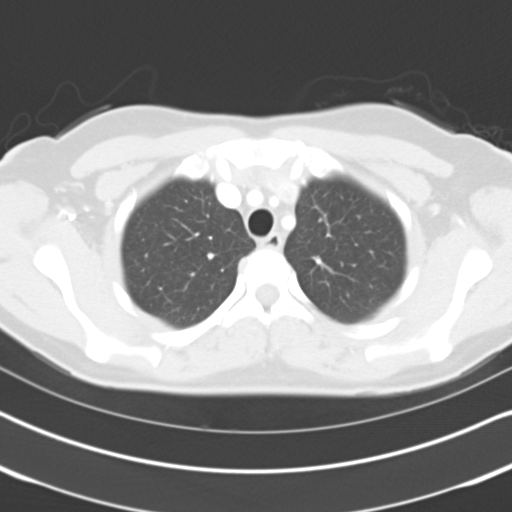
[im 49/58  lung]
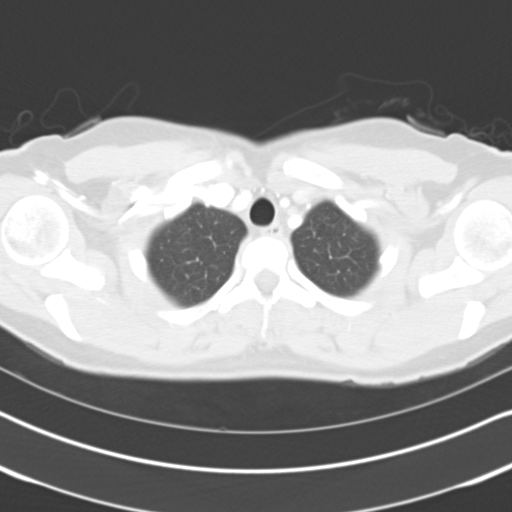
[im 53/58  lung]
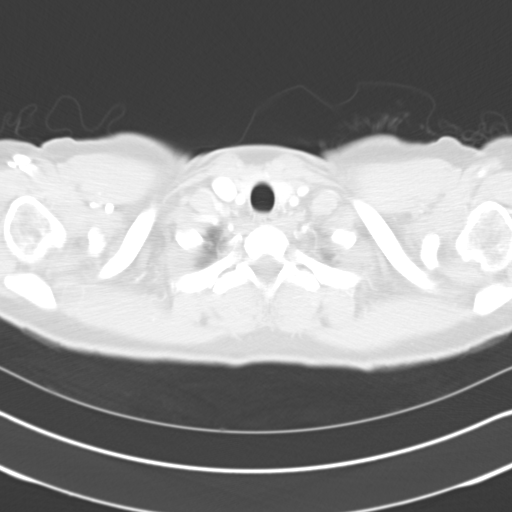

[Series 602: <mpr thick range> · coronal · 0.63mm/px · 3 of 76 slices shown]
[im 16/76  lung]
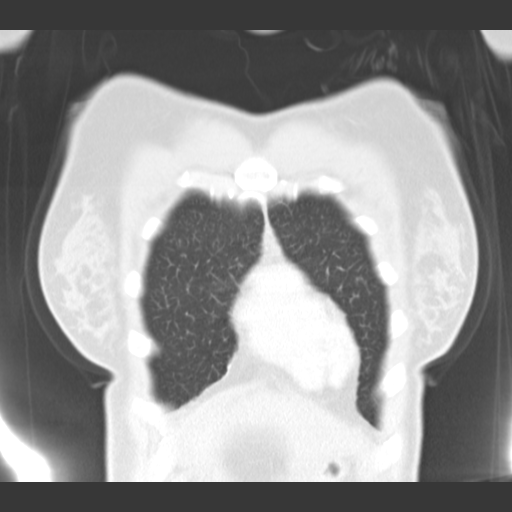
[im 31/76  lung]
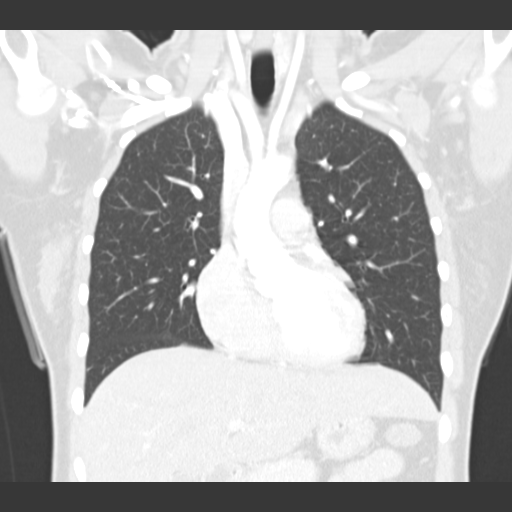
[im 46/76  lung]
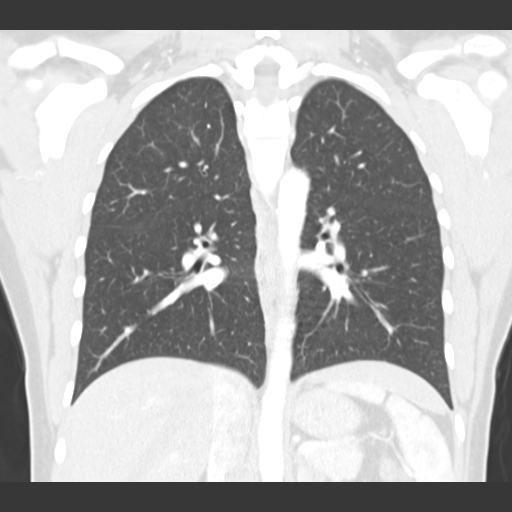

[15 of 36 positions shown; findings below may reference images not displayed]

FINDINGS: No evidence of thoracic aortic aneurysm or dissection.
No evidence of mediastinal or hilar masses.  No adenopathy seen
elsewhere within the thorax.

There is no evidence of pleural or pericardial effusion.  No
evidence of pulmonary infiltrate.  A 6 mm noncalcified pulmonary
nodule is seen in the superior segment of the right lower lobe on
image 28, which is indeterminate.  No other suspicious pulmonary
nodules or masses are identified.
IMPRESSION: 1.  No acute findings.
2.  6 mm indeterminate pulmonary nodule in the superior segment of
the right lower lobe. If the patient is at high risk for
bronchogenic carcinoma, follow-up chest CT at 6-12 months is
recommended.  If the patient is at low risk for bronchogenic
carcinoma, follow-up chest CT at 12 months is recommended.  This
recommendation follows the consensus statement: Guidelines for
Management of Small Pulmonary Nodules Detected on CT Scans: A
Statement from the [HOSPITAL] as published in Radiology
8220; [DATE]. Online at:
[URL]

## 2012-05-30 ENCOUNTER — Telehealth: Payer: Self-pay | Admitting: Internal Medicine

## 2012-05-30 NOTE — Telephone Encounter (Signed)
Patient Information:  Caller Name: Carrie Morrison  Phone: [phone]  Patient: Carrie, Morrison  Gender: Female  DOB: Jan 06, 1981  Age: 32 Years  PCP: Lelon Perla.  Pregnant: No  Office Follow Up:  Does the office need to follow up with this patient?: No  Instructions For The Office: N/A  RN Note:  Caller states plan not to go to ED; states she will try to get another provider to see her in the office instead.  Symptoms  Reason For Call & Symptoms: Chest pain woke her at 01:00 - burning, shooting pain in chest and arms.  History of parents having MI at early age.  Pain in arms rated at 6-7 of 10 and  caused nausea.  She also states she feels very weak. Shooting pains last several seconds and "feels like my heart wants to beat out of my chest."  Reviewed Health History In EMR: Yes  Reviewed Medications In EMR: Yes  Reviewed Allergies In EMR: Yes  Reviewed Surgeries / Procedures: Yes  Date of Onset of Symptoms: 05/30/2012 OB / GYN:  LMP: 05/01/2012  Guideline(s) Used:  Chest Pain  Disposition Per Guideline:   Go to ED Now  Reason For Disposition Reached:   Pain also present in shoulder(s) or arm(s) or jaw  Advice Given:  N/A  Patient Refused Recommendation:  Patient Refused Care Advice  States she will call another office to see if they will give her appointment instead of sending to ED for evaluation.

## 2012-08-16 ENCOUNTER — Encounter: Payer: Self-pay | Admitting: Family Medicine

## 2012-08-16 ENCOUNTER — Ambulatory Visit (INDEPENDENT_AMBULATORY_CARE_PROVIDER_SITE_OTHER): Payer: BC Managed Care – PPO | Admitting: Family Medicine

## 2012-08-16 VITALS — BP 110/74 | HR 99 | Temp 98.3°F | Ht 62.75 in | Wt 138.0 lb

## 2012-08-16 DIAGNOSIS — R079 Chest pain, unspecified: Secondary | ICD-10-CM

## 2012-08-16 DIAGNOSIS — R209 Unspecified disturbances of skin sensation: Secondary | ICD-10-CM

## 2012-08-16 DIAGNOSIS — R200 Anesthesia of skin: Secondary | ICD-10-CM

## 2012-08-16 NOTE — Progress Notes (Signed)
  Subjective:    Carrie Morrison is a 32 y.o. female who presents for evaluation of chest pain. Onset was several  months ago. Symptoms have been unchanged since that time. The patient describes the pain as burning and radiates to the left arm and right arm. Patient rates pain as a 5/10 in intensity. Associated symptoms are: chest pain and numbness and tingling in arms and hands. Aggravating factors are: none. Alleviating factors are: none. Patient's cardiac risk factors are: family history of premature cardiovascular disease. Patient's risk factors for DVT/PE: none. Previous cardiac testing: chest x-ray, electrocardiogram (ECG) and exercise stress test and cardiac MRI.  The following portions of the patient's history were reviewed and updated as appropriate:  She  has a past medical history of GERD (gastroesophageal reflux disease); Pulmonary nodule, right; and Hyperlipemia. She  does not have any pertinent problems on file. She  has past surgical history that includes Cesarean section (11/04/09); Mouth surgery (1995); and Rhinoplasty (2009). Her family history includes Asthma in her brother; Colon cancer in her maternal grandfather, maternal grandmother, and paternal grandmother; Diabetes in her paternal grandmother; Heart attack in her paternal grandfather; Heart attack (age of onset: 44) in her father; Heart attack (age of onset: 19) in her mother; Heart disease in her father and mother; Hyperlipidemia in her father; Hypertension in her father; and Stroke in her paternal grandmother. She  reports that she quit smoking about 3 years ago. She has never used smokeless tobacco. She reports that she drinks about 4.2 ounces of alcohol per week. She reports that she does not use illicit drugs. She has a current medication list which includes the following prescription(s): acetaminophen, aspirin, fish oil-omega-3 fatty acids, and flaxseed (linseed). Current Outpatient Prescriptions on File Prior to Visit   Medication Sig Dispense Refill  . acetaminophen (TYLENOL) 500 MG tablet Take 500 mg by mouth every 6 (six) hours as needed. Patient used this medication for cramps.      Marland Kitchen aspirin 81 MG tablet Take 81 mg by mouth daily.       . fish oil-omega-3 fatty acids 1000 MG capsule Take 1 g by mouth daily.      . Flaxseed, Linseed, (FLAX SEEDS PO) Take 1 capsule by mouth daily.       No current facility-administered medications on file prior to visit.   She is allergic to latex and fluconazole in dextrose..  Review of Systems Pertinent items are noted in HPI.    Objective:    BP 110/74  Pulse 99  Temp(Src) 98.3 F (36.8 C) (Oral)  Ht 5' 2.75" (1.594 m)  Wt 138 lb (62.596 kg)  BMI 24.64 kg/m2  SpO2 97% General appearance: alert, cooperative, appears stated age and no distress Neck: no adenopathy, supple, symmetrical, trachea midline and thyroid not enlarged, symmetric, no tenderness/mass/nodules Lungs: clear to auscultation bilaterally Heart: S1, S2 normal Abdomen: soft, non-tender; bowel sounds normal; no masses,  no organomegaly  Cardiographics ECG: normal sinus rhythm, no blocks or conduction defects, no ischemic changes  Imaging Chest x-ray: normal chest x-ray and it was done in er    Assessment:    Chest pain, suspected etiology: gerd vs anxiety/ stress   b/l numbness / tingling in arms---refer to neuro  Plan:    Patient history and exam consistent with non-cardiac cause of chest pain. Conservative measures indicated. Worsening signs and symptoms discussed and patient verbalized understanding. prilosec otc daily

## 2012-08-16 NOTE — Patient Instructions (Addendum)
Diet for Gastroesophageal Reflux Disease, Adult  Reflux (acid reflux) is when acid from your stomach flows up into the esophagus. When acid comes in contact with the esophagus, the acid causes irritation and soreness (inflammation) in the esophagus. When reflux happens often or so severely that it causes damage to the esophagus, it is called gastroesophageal reflux disease (GERD). Nutrition therapy can help ease the discomfort of GERD.  FOODS OR DRINKS TO AVOID OR LIMIT   Smoking or chewing tobacco. Nicotine is one of the most potent stimulants to acid production in the gastrointestinal tract.   Caffeinated and decaffeinated coffee and black tea.   Regular or low-calorie carbonated beverages or energy drinks (caffeine-free carbonated beverages are allowed).    Strong spices, such as black pepper, white pepper, red pepper, cayenne, curry powder, and chili powder.   Peppermint or spearmint.   Chocolate.   High-fat foods, including meats and fried foods. Extra added fats including oils, butter, salad dressings, and nuts. Limit these to less than 8 tsp per day.   Fruits and vegetables if they are not tolerated, such as citrus fruits or tomatoes.   Alcohol.   Any food that seems to aggravate your condition.  If you have questions regarding your diet, call your caregiver or a registered dietitian.  OTHER THINGS THAT MAY HELP GERD INCLUDE:    Eating your meals slowly, in a relaxed setting.   Eating 5 to 6 small meals per day instead of 3 large meals.   Eliminating food for a period of time if it causes distress.   Not lying down until 3 hours after eating a meal.   Keeping the head of your bed raised 6 to 9 inches (15 to 23 cm) by using a foam wedge or blocks under the legs of the bed. Lying flat may make symptoms worse.   Being physically active. Weight loss may be helpful in reducing reflux in overweight or obese adults.   Wear loose fitting clothing  EXAMPLE MEAL PLAN  This meal plan is approximately  2,000 calories based on [employer] meal planning guidelines.  Breakfast    cup cooked oatmeal.   1 cup strawberries.   1 cup low-fat milk.   1 oz almonds.  Snack   1 cup cucumber slices.   6 oz yogurt (made from low-fat or fat-free milk).  Lunch   2 slice whole-wheat bread.   2 oz sliced turkey.   2 tsp mayonnaise.   1 cup blueberries.   1 cup snap peas.  Snack   6 whole-wheat crackers.   1 oz string cheese.  Dinner    cup brown rice.   1 cup mixed veggies.   1 tsp olive oil.   3 oz grilled fish.  Document Released: 04/20/2005 Document Revised: 07/13/2011 Document Reviewed: 03/06/2011  [employer] Patient Information 2013 [employer].

## 2012-08-19 ENCOUNTER — Ambulatory Visit (INDEPENDENT_AMBULATORY_CARE_PROVIDER_SITE_OTHER): Payer: BC Managed Care – PPO | Admitting: Family Medicine

## 2012-08-19 ENCOUNTER — Encounter: Payer: Self-pay | Admitting: Family Medicine

## 2012-08-19 VITALS — BP 106/68 | HR 85 | Temp 98.0°F | Resp 16 | Ht 64.0 in | Wt 139.1 lb

## 2012-08-19 DIAGNOSIS — Z0000 Encounter for general adult medical examination without abnormal findings: Secondary | ICD-10-CM

## 2012-08-19 LAB — LIPID PANEL
Cholesterol: 198 mg/dL (ref 0–200)
HDL: 83.4 mg/dL (ref >39.00)
Triglycerides: 43 mg/dL (ref 0.0–149.0)
VLDL: 8.6 mg/dL (ref 0.0–40.0)

## 2012-08-19 LAB — BASIC METABOLIC PANEL
CO2: 24 mEq/L (ref 19–32)
Calcium: 9.4 mg/dL (ref 8.4–10.5)
Chloride: 104 mEq/L (ref 96–112)
Glucose, Bld: 88 mg/dL (ref 70–99)
Sodium: 136 mEq/L (ref 135–145)

## 2012-08-19 LAB — CBC WITH DIFFERENTIAL/PLATELET
Basophils Absolute: 0 10*3/uL (ref 0.0–0.1)
Basophils Relative: 0.6 % (ref 0.0–3.0)
Eosinophils Absolute: 0.2 10*3/uL (ref 0.0–0.7)
Hemoglobin: 14.3 g/dL (ref 12.0–15.0)
Lymphocytes Relative: 33.8 % (ref 12.0–46.0)
Lymphs Abs: 1.9 10*3/uL (ref 0.7–4.0)
MCHC: 33.8 g/dL (ref 30.0–36.0)
MCV: 96.2 fl (ref 78.0–100.0)
Monocytes Absolute: 0.4 10*3/uL (ref 0.1–1.0)
Neutro Abs: 3.2 10*3/uL (ref 1.4–7.7)
RBC: 4.38 Mil/uL (ref 3.87–5.11)
RDW: 12.1 % (ref 11.5–14.6)

## 2012-08-19 LAB — HEPATIC FUNCTION PANEL
Albumin: 4.2 g/dL (ref 3.5–5.2)
Alkaline Phosphatase: 49 U/L (ref 39–117)
Total Protein: 7.1 g/dL (ref 6.0–8.3)

## 2012-08-19 NOTE — Progress Notes (Signed)
Subjective:     Carrie Morrison is a 32 y.o. female and is here for a comprehensive physical exam. The patient reports no new problems.  History   Social History  . Marital Status: Married    Spouse Name: N/A    Number of Children: N/A  . Years of Education: N/A   Occupational History  . Not on file.   Social History Main Topics  . Smoking status: Former Smoker    Quit date: 12/23/2008  . Smokeless tobacco: Never Used  . Alcohol Use: 4.2 oz/week    7 Glasses of wine per week  . Drug Use: No  . Sexually Active: Yes -- Female partner(s)    Birth Control/ Protection: None   Other Topics Concern  . Not on file   Social History Narrative  . No narrative on file   Health Maintenance  Topic Date Due  . Influenza Vaccine  01/02/2013  . Pap Smear  03/29/2015  . Tetanus/tdap  07/03/2018    The following portions of the patient's history were reviewed and updated as appropriate:  She  has a past medical history of GERD (gastroesophageal reflux disease); Pulmonary nodule, right; and Hyperlipemia. She  does not have any pertinent problems on file. She  has past surgical history that includes Cesarean section (11/04/09); Mouth surgery (1995); and Rhinoplasty (2009). Her family history includes Asthma in her brother; Colon cancer in her maternal grandfather, maternal grandmother, and paternal grandmother; Diabetes in her paternal grandmother; Heart attack in her paternal grandfather; Heart attack (age of onset: 42) in her father; Heart attack (age of onset: 51) in her mother; Heart disease in her father and mother; Hyperlipidemia in her father; Hypertension in her father; and Stroke in her paternal grandmother. She  reports that she quit smoking about 3 years ago. She has never used smokeless tobacco. She reports that she drinks about 4.2 ounces of alcohol per week. She reports that she does not use illicit drugs. She has a current medication list which includes the following  prescription(s): aspirin, fish oil-omega-3 fatty acids, and flaxseed (linseed). Current Outpatient Prescriptions on File Prior to Visit  Medication Sig Dispense Refill  . aspirin 81 MG tablet Take 81 mg by mouth daily.       . fish oil-omega-3 fatty acids 1000 MG capsule Take 1 g by mouth daily.      . Flaxseed, Linseed, (FLAX SEEDS PO) Take 1 capsule by mouth daily.       No current facility-administered medications on file prior to visit.   She is allergic to latex and fluconazole in dextrose..  Review of Systems Review of Systems  Constitutional: Negative for activity change, appetite change and fatigue.  HENT: Negative for hearing loss, congestion, tinnitus and ear discharge.  dentist q87m Eyes: Negative for visual disturbance (see optho - due) Respiratory: Negative for cough, chest tightness and shortness of breath.   Cardiovascular: Negative for chest pain, palpitations and leg swelling.  Gastrointestinal: Negative for abdominal pain, diarrhea, constipation and abdominal distention.  Genitourinary: Negative for urgency, frequency, decreased urine volume and difficulty urinating.  Musculoskeletal: Negative for back pain, arthralgias and gait problem.  Skin: Negative for color change, pallor and rash.  Neurological: Negative for dizziness, light-headedness, numbness and headaches.  Hematological: Negative for adenopathy. Does not bruise/bleed easily.  Psychiatric/Behavioral: Negative for suicidal ideas, confusion, sleep disturbance, self-injury, dysphoric mood, decreased concentration and agitation.       Objective:    BP 106/68  Pulse 85  Temp(Src) 98 F (36.7 C) (Oral)  Resp 16  Ht 5\' 4"  (1.626 m)  Wt 139 lb 2 oz (63.107 kg)  BMI 23.87 kg/m2  SpO2 97%  LMP 08/05/2012 General appearance: alert, cooperative, appears stated age and no distress Head: Normocephalic, without obvious abnormality, atraumatic Eyes: conjunctivae/corneas clear. PERRL, EOM's intact. Fundi  benign. Ears: normal TM's and external ear canals both ears Nose: Nares normal. Septum midline. Mucosa normal. No drainage or sinus tenderness. Throat: lips, mucosa, and tongue normal; teeth and gums normal Neck: no adenopathy, no carotid bruit, no JVD, supple, symmetrical, trachea midline and thyroid not enlarged, symmetric, no tenderness/mass/nodules Back: symmetric, no curvature. ROM normal. No CVA tenderness. Lungs: clear to auscultation bilaterally Breasts: gyn Heart: regular rate and rhythm, S1, S2 normal, no murmur, click, rub or gallop Abdomen: soft, non-tender; bowel sounds normal; no masses,  no organomegaly Pelvic: deferred--gyn Extremities: extremities normal, atraumatic, no cyanosis or edema Pulses: 2+ and symmetric Skin: Skin color, texture, turgor normal. No rashes or lesions Lymph nodes: Cervical, supraclavicular, and axillary nodes normal. Neurologic: Alert and oriented X 3, normal strength and tone. Normal symmetric reflexes. Normal coordination and gait psych-- no anxiety, no stress    Assessment:    Healthy female exam.      Plan:    check labs ghm utd See After Visit Summary for Counseling Recommendations

## 2012-08-19 NOTE — Patient Instructions (Signed)
Preventive Care for Adults, Female A healthy lifestyle and preventive care can promote health and wellness. Preventive health guidelines for women include the following key practices.  A routine yearly physical is a good way to check with your caregiver about your health and preventive screening. It is a chance to share any concerns and updates on your health, and to receive a thorough exam.  Visit your dentist for a routine exam and preventive care every 6 months. Brush your teeth twice a day and floss once a day. Good oral hygiene prevents tooth decay and gum disease.  The frequency of eye exams is based on your age, health, family medical history, use of contact lenses, and other factors. Follow your caregiver's recommendations for frequency of eye exams.  Eat a healthy diet. Foods like vegetables, fruits, whole grains, low-fat dairy products, and lean protein foods contain the nutrients you need without too many calories. Decrease your intake of foods high in solid fats, added sugars, and salt. Eat the right amount of calories for you.Get information about a proper diet from your caregiver, if necessary.  Regular physical exercise is one of the most important things you can do for your health. Most adults should get at least 150 minutes of moderate-intensity exercise (any activity that increases your heart rate and causes you to sweat) each week. In addition, most adults need muscle-strengthening exercises on 2 or more days a week.  Maintain a healthy weight. The body mass index (BMI) is a screening tool to identify possible weight problems. It provides an estimate of body fat based on height and weight. Your caregiver can help determine your BMI, and can help you achieve or maintain a healthy weight.For adults 20 years and older:  A BMI below 18.5 is considered underweight.  A BMI of 18.5 to 24.9 is normal.  A BMI of 25 to 29.9 is considered overweight.  A BMI of 30 and above is  considered obese.  Maintain normal blood lipids and cholesterol levels by exercising and minimizing your intake of saturated fat. Eat a balanced diet with plenty of fruit and vegetables. Blood tests for lipids and cholesterol should begin at age 20 and be repeated every 5 years. If your lipid or cholesterol levels are high, you are over 50, or you are at high risk for heart disease, you may need your cholesterol levels checked more frequently.Ongoing high lipid and cholesterol levels should be treated with medicines if diet and exercise are not effective.  If you smoke, find out from your caregiver how to quit. If you do not use tobacco, do not start.  If you are pregnant, do not drink alcohol. If you are breastfeeding, be very cautious about drinking alcohol. If you are not pregnant and choose to drink alcohol, do not exceed 1 drink per day. One drink is considered to be 12 ounces (355 mL) of beer, 5 ounces (148 mL) of wine, or 1.5 ounces (44 mL) of liquor.  Avoid use of street drugs. Do not share needles with anyone. Ask for help if you need support or instructions about stopping the use of drugs.  High blood pressure causes heart disease and increases the risk of stroke. Your blood pressure should be checked at least every 1 to 2 years. Ongoing high blood pressure should be treated with medicines if weight loss and exercise are not effective.  If you are 55 to 33 years old, ask your caregiver if you should take aspirin to prevent strokes.  Diabetes   screening involves taking a blood sample to check your fasting blood sugar level. This should be done once every 3 years, after age 45, if you are within normal weight and without risk factors for diabetes. Testing should be considered at a younger age or be carried out more frequently if you are overweight and have at least 1 risk factor for diabetes.  Breast cancer screening is essential preventive care for women. You should practice "breast  self-awareness." This means understanding the normal appearance and feel of your breasts and may include breast self-examination. Any changes detected, no matter how small, should be reported to a caregiver. Women in their 20s and 30s should have a clinical breast exam (CBE) by a caregiver as part of a regular health exam every 1 to 3 years. After age 40, women should have a CBE every year. Starting at age 40, women should consider having a mammography (breast X-ray test) every year. Women who have a family history of breast cancer should talk to their caregiver about genetic screening. Women at a high risk of breast cancer should talk to their caregivers about having magnetic resonance imaging (MRI) and a mammography every year.  The Pap test is a screening test for cervical cancer. A Pap test can show cell changes on the cervix that might become cervical cancer if left untreated. A Pap test is a procedure in which cells are obtained and examined from the lower end of the uterus (cervix).  Women should have a Pap test starting at age 21.  Between ages 21 and 29, Pap tests should be repeated every 2 years.  Beginning at age 30, you should have a Pap test every 3 years as long as the past 3 Pap tests have been normal.  Some women have medical problems that increase the chance of getting cervical cancer. Talk to your caregiver about these problems. It is especially important to talk to your caregiver if a new problem develops soon after your last Pap test. In these cases, your caregiver may recommend more frequent screening and Pap tests.  The above recommendations are the same for women who have or have not gotten the vaccine for human papillomavirus (HPV).  If you had a hysterectomy for a problem that was not cancer or a condition that could lead to cancer, then you no longer need Pap tests. Even if you no longer need a Pap test, a regular exam is a good idea to make sure no other problems are  starting.  If you are between ages 65 and 70, and you have had normal Pap tests going back 10 years, you no longer need Pap tests. Even if you no longer need a Pap test, a regular exam is a good idea to make sure no other problems are starting.  If you have had past treatment for cervical cancer or a condition that could lead to cancer, you need Pap tests and screening for cancer for at least 20 years after your treatment.  If Pap tests have been discontinued, risk factors (such as a new sexual partner) need to be reassessed to determine if screening should be resumed.  The HPV test is an additional test that may be used for cervical cancer screening. The HPV test looks for the virus that can cause the cell changes on the cervix. The cells collected during the Pap test can be tested for HPV. The HPV test could be used to screen women aged 30 years and older, and should   be used in women of any age who have unclear Pap test results. After the age of 30, women should have HPV testing at the same frequency as a Pap test.  Colorectal cancer can be detected and often prevented. Most routine colorectal cancer screening begins at the age of 50 and continues through age 75. However, your caregiver may recommend screening at an earlier age if you have risk factors for colon cancer. On a yearly basis, your caregiver may provide home test kits to check for hidden blood in the stool. Use of a small camera at the end of a tube, to directly examine the colon (sigmoidoscopy or colonoscopy), can detect the earliest forms of colorectal cancer. Talk to your caregiver about this at age 50, when routine screening begins. Direct examination of the colon should be repeated every 5 to 10 years through age 75, unless early forms of pre-cancerous polyps or small growths are found.  Hepatitis C blood testing is recommended for all people born from 1945 through 1965 and any individual with known risks for hepatitis C.  Practice  safe sex. Use condoms and avoid high-risk sexual practices to reduce the spread of sexually transmitted infections (STIs). STIs include gonorrhea, chlamydia, syphilis, trichomonas, herpes, HPV, and human immunodeficiency virus (HIV). Herpes, HIV, and HPV are viral illnesses that have no cure. They can result in disability, cancer, and death. Sexually active women aged 25 and younger should be checked for chlamydia. Older women with new or multiple partners should also be tested for chlamydia. Testing for other STIs is recommended if you are sexually active and at increased risk.  Osteoporosis is a disease in which the bones lose minerals and strength with aging. This can result in serious bone fractures. The risk of osteoporosis can be identified using a bone density scan. Women ages 65 and over and women at risk for fractures or osteoporosis should discuss screening with their caregivers. Ask your caregiver whether you should take a calcium supplement or vitamin D to reduce the rate of osteoporosis.  Menopause can be associated with physical symptoms and risks. Hormone replacement therapy is available to decrease symptoms and risks. You should talk to your caregiver about whether hormone replacement therapy is right for you.  Use sunscreen with sun protection factor (SPF) of 30 or more. Apply sunscreen liberally and repeatedly throughout the day. You should seek shade when your shadow is shorter than you. Protect yourself by wearing long sleeves, pants, a wide-brimmed hat, and sunglasses year round, whenever you are outdoors.  Once a month, do a whole body skin exam, using a mirror to look at the skin on your back. Notify your caregiver of new moles, moles that have irregular borders, moles that are larger than a pencil eraser, or moles that have changed in shape or color.  Stay current with required immunizations.  Influenza. You need a dose every fall (or winter). The composition of the flu vaccine  changes each year, so being vaccinated once is not enough.  Pneumococcal polysaccharide. You need 1 to 2 doses if you smoke cigarettes or if you have certain chronic medical conditions. You need 1 dose at age 65 (or older) if you have never been vaccinated.  Tetanus, diphtheria, pertussis (Tdap, Td). Get 1 dose of Tdap vaccine if you are younger than age 65, are over 65 and have contact with an infant, are a healthcare worker, are pregnant, or simply want to be protected from whooping cough. After that, you need a Td   booster dose every 10 years. Consult your caregiver if you have not had at least 3 tetanus and diphtheria-containing shots sometime in your life or have a deep or dirty wound.  HPV. You need this vaccine if you are a woman age 26 or younger. The vaccine is given in 3 doses over 6 months.  Measles, mumps, rubella (MMR). You need at least 1 dose of MMR if you were born in 1957 or later. You may also need a second dose.  Meningococcal. If you are age 19 to 21 and a first-year college student living in a residence hall, or have one of several medical conditions, you need to get vaccinated against meningococcal disease. You may also need additional booster doses.  Zoster (shingles). If you are age 60 or older, you should get this vaccine.  Varicella (chickenpox). If you have never had chickenpox or you were vaccinated but received only 1 dose, talk to your caregiver to find out if you need this vaccine.  Hepatitis A. You need this vaccine if you have a specific risk factor for hepatitis A virus infection or you simply wish to be protected from this disease. The vaccine is usually given as 2 doses, 6 to 18 months apart.  Hepatitis B. You need this vaccine if you have a specific risk factor for hepatitis B virus infection or you simply wish to be protected from this disease. The vaccine is given in 3 doses, usually over 6 months. Preventive Services / Frequency Ages 19 to 39  Blood  pressure check.** / Every 1 to 2 years.  Lipid and cholesterol check.** / Every 5 years beginning at age 20.  Clinical breast exam.** / Every 3 years for women in their 20s and 30s.  Pap test.** / Every 2 years from ages 21 through 29. Every 3 years starting at age 30 through age 65 or 70 with a history of 3 consecutive normal Pap tests.  HPV screening.** / Every 3 years from ages 30 through ages 65 to 70 with a history of 3 consecutive normal Pap tests.  Hepatitis C blood test.** / For any individual with known risks for hepatitis C.  Skin self-exam. / Monthly.  Influenza immunization.** / Every year.  Pneumococcal polysaccharide immunization.** / 1 to 2 doses if you smoke cigarettes or if you have certain chronic medical conditions.  Tetanus, diphtheria, pertussis (Tdap, Td) immunization. / A one-time dose of Tdap vaccine. After that, you need a Td booster dose every 10 years.  HPV immunization. / 3 doses over 6 months, if you are 26 and younger.  Measles, mumps, rubella (MMR) immunization. / You need at least 1 dose of MMR if you were born in 1957 or later. You may also need a second dose.  Meningococcal immunization. / 1 dose if you are age 19 to 21 and a first-year college student living in a residence hall, or have one of several medical conditions, you need to get vaccinated against meningococcal disease. You may also need additional booster doses.  Varicella immunization.** / Consult your caregiver.  Hepatitis A immunization.** / Consult your caregiver. 2 doses, 6 to 18 months apart.  Hepatitis B immunization.** / Consult your caregiver. 3 doses usually over 6 months. Ages 40 to 64  Blood pressure check.** / Every 1 to 2 years.  Lipid and cholesterol check.** / Every 5 years beginning at age 20.  Clinical breast exam.** / Every year after age 40.  Mammogram.** / Every year beginning at age 40   and continuing for as long as you are in good health. Consult with your  caregiver.  Pap test.** / Every 3 years starting at age 30 through age 65 or 70 with a history of 3 consecutive normal Pap tests.  HPV screening.** / Every 3 years from ages 30 through ages 65 to 70 with a history of 3 consecutive normal Pap tests.  Fecal occult blood test (FOBT) of stool. / Every year beginning at age 50 and continuing until age 75. You may not need to do this test if you get a colonoscopy every 10 years.  Flexible sigmoidoscopy or colonoscopy.** / Every 5 years for a flexible sigmoidoscopy or every 10 years for a colonoscopy beginning at age 50 and continuing until age 75.  Hepatitis C blood test.** / For all people born from 1945 through 1965 and any individual with known risks for hepatitis C.  Skin self-exam. / Monthly.  Influenza immunization.** / Every year.  Pneumococcal polysaccharide immunization.** / 1 to 2 doses if you smoke cigarettes or if you have certain chronic medical conditions.  Tetanus, diphtheria, pertussis (Tdap, Td) immunization.** / A one-time dose of Tdap vaccine. After that, you need a Td booster dose every 10 years.  Measles, mumps, rubella (MMR) immunization. / You need at least 1 dose of MMR if you were born in 1957 or later. You may also need a second dose.  Varicella immunization.** / Consult your caregiver.  Meningococcal immunization.** / Consult your caregiver.  Hepatitis A immunization.** / Consult your caregiver. 2 doses, 6 to 18 months apart.  Hepatitis B immunization.** / Consult your caregiver. 3 doses, usually over 6 months. Ages 65 and over  Blood pressure check.** / Every 1 to 2 years.  Lipid and cholesterol check.** / Every 5 years beginning at age 20.  Clinical breast exam.** / Every year after age 40.  Mammogram.** / Every year beginning at age 40 and continuing for as long as you are in good health. Consult with your caregiver.  Pap test.** / Every 3 years starting at age 30 through age 65 or 70 with a 3  consecutive normal Pap tests. Testing can be stopped between 65 and 70 with 3 consecutive normal Pap tests and no abnormal Pap or HPV tests in the past 10 years.  HPV screening.** / Every 3 years from ages 30 through ages 65 or 70 with a history of 3 consecutive normal Pap tests. Testing can be stopped between 65 and 70 with 3 consecutive normal Pap tests and no abnormal Pap or HPV tests in the past 10 years.  Fecal occult blood test (FOBT) of stool. / Every year beginning at age 50 and continuing until age 75. You may not need to do this test if you get a colonoscopy every 10 years.  Flexible sigmoidoscopy or colonoscopy.** / Every 5 years for a flexible sigmoidoscopy or every 10 years for a colonoscopy beginning at age 50 and continuing until age 75.  Hepatitis C blood test.** / For all people born from 1945 through 1965 and any individual with known risks for hepatitis C.  Osteoporosis screening.** / A one-time screening for women ages 65 and over and women at risk for fractures or osteoporosis.  Skin self-exam. / Monthly.  Influenza immunization.** / Every year.  Pneumococcal polysaccharide immunization.** / 1 dose at age 65 (or older) if you have never been vaccinated.  Tetanus, diphtheria, pertussis (Tdap, Td) immunization. / A one-time dose of Tdap vaccine if you are over   65 and have contact with an infant, are a healthcare worker, or simply want to be protected from whooping cough. After that, you need a Td booster dose every 10 years.  Varicella immunization.** / Consult your caregiver.  Meningococcal immunization.** / Consult your caregiver.  Hepatitis A immunization.** / Consult your caregiver. 2 doses, 6 to 18 months apart.  Hepatitis B immunization.** / Check with your caregiver. 3 doses, usually over 6 months. ** Family history and personal history of risk and conditions may change your caregiver's recommendations. Document Released: 06/16/2001 Document Revised: 07/13/2011  Document Reviewed: 09/15/2010 [employer] Patient Information 2013 [employer].  

## 2012-08-23 LAB — POCT URINALYSIS DIPSTICK
Bilirubin, UA: NEGATIVE
Ketones, UA: NEGATIVE
Leukocytes, UA: NEGATIVE
Protein, UA: NEGATIVE
Spec Grav, UA: >=1.03
pH, UA: 6

## 2012-09-30 ENCOUNTER — Encounter: Payer: Self-pay | Admitting: Family Medicine

## 2012-10-07 ENCOUNTER — Telehealth: Payer: Self-pay | Admitting: Family Medicine

## 2012-10-07 NOTE — Telephone Encounter (Signed)
In reference to Neurology referral entered 08/16/12, patient an existing patient of Dr. Sandria Manly, and was contacted and left multiple messages by [facility].  Patient was made aware she had unfinished business to take care of prior to appointment being offered, and stated she would call back and take care of.  I also mailed patient a letter.  As of today, patient will not respond.

## 2012-10-07 NOTE — Telephone Encounter (Signed)
noted 

## 2012-10-12 ENCOUNTER — Telehealth: Payer: Self-pay | Admitting: Family Medicine

## 2012-10-12 NOTE — Telephone Encounter (Signed)
Patient Information:  Caller Name: Kayle  Phone: [phone]  Patient: Carrie Morrison, Carrie Morrison  Gender: Female  DOB: [birth year]  Age: 32 Years  PCP: Lelon Perla.  Pregnant: No  Office Follow Up:  Does the office need to follow up with this patient?: No  Instructions For The Office: N/A  RN Note:  Pt states she will go to Urgent Care since there are no appts. this afternoon.   Symptoms  Reason For Call & Symptoms: Mom states she has a twitching under the chin under the right side and her face feels slight tingling on the right.  Reviewed Health History In EMR: Yes  Reviewed Medications In EMR: Yes  Reviewed Allergies In EMR: Yes  Reviewed Surgeries / Procedures: Yes  Date of Onset of Symptoms: 10/12/2012 OB / GYN:  LMP: 09/28/2012  Guideline(s) Used:  Face Pain  Headache  Disposition Per Guideline:   See Today or Tomorrow in Office  Reason For Disposition Reached:   Unexplained headache that is present > 24 hours  Advice Given:  Call Back If:  Headache lasts longer than 24 hours  You become worse.  Patient Will Follow Care Advice:  YES

## 2012-10-12 NOTE — Telephone Encounter (Signed)
Noted  

## 2012-10-18 ENCOUNTER — Other Ambulatory Visit: Payer: Self-pay | Admitting: Family Medicine

## 2012-10-18 ENCOUNTER — Ambulatory Visit (HOSPITAL_BASED_OUTPATIENT_CLINIC_OR_DEPARTMENT_OTHER)
Admission: RE | Admit: 2012-10-18 | Discharge: 2012-10-18 | Disposition: A | Discharge status: Home / Self Care | Payer: BC Managed Care – PPO | Source: Ambulatory Visit | Attending: Family Medicine | Admitting: Family Medicine

## 2012-10-18 ENCOUNTER — Encounter: Payer: Self-pay | Admitting: Family Medicine

## 2012-10-18 ENCOUNTER — Ambulatory Visit (INDEPENDENT_AMBULATORY_CARE_PROVIDER_SITE_OTHER): Payer: BC Managed Care – PPO | Admitting: Family Medicine

## 2012-10-18 VITALS — BP 98/72 | HR 78 | Temp 98.4°F | Wt 136.4 lb

## 2012-10-18 DIAGNOSIS — M79662 Pain in left lower leg: Secondary | ICD-10-CM

## 2012-10-18 DIAGNOSIS — M79609 Pain in unspecified limb: Secondary | ICD-10-CM

## 2012-10-18 NOTE — Progress Notes (Signed)
  Subjective:    Patient ID: Carrie Morrison, female    DOB: 1980-09-02, 32 y.o.   MRN: [id number]  HPI Pt here c/o pain in L calf and numbess lateral calf after olive oil container fell and hit her leg.  It did bruise and that went away but she is left with pain and numbness.   Review of Systems As above    Objective:   Physical Exam BP 98/72  Pulse 78  Temp(Src) 98.4 F (36.9 C) (Oral)  Wt 136 lb 6.4 oz (61.871 kg)  BMI 23.4 kg/m2  SpO2 99% General appearance: alert, cooperative, appears stated age and no distress Neck: no adenopathy, supple, symmetrical, trachea midline and thyroid not enlarged, symmetric, no tenderness/mass/nodules Lungs: clear to auscultation bilaterally Heart: regular rate and rhythm, S1, S2 normal, no murmur, click, rub or gallop Extremities: extremities normal, atraumatic, no cyanosis or edema--+ calf pain L leg no errythema Neurologic: Alert and oriented X 3, normal strength and tone. Normal symmetric reflexes. Normal coordination and gait        Assessment & Plan:

## 2012-10-18 NOTE — Assessment & Plan Note (Signed)
Check doppler  R/o dvt Alt ice/ heat If doppler neg--- rto if symptoms do not resolve and we may need to look at back as etiology

## 2012-10-18 NOTE — Patient Instructions (Addendum)
Deep Vein Thrombosis A deep vein thrombosis (DVT) is a blood clot that develops in a deep vein. A DVT is a clot in the deep, larger veins of the leg, arm, or pelvis. These are more dangerous than clots that might form in veins near the surface of the body. A DVT can lead to complications if the clot breaks off and travels in the bloodstream to the lungs.  A DVT can damage the valves in your leg veins, so that instead of flowing upwards, the blood pools in the lower leg. This is called post-thrombotic syndrome, and can result in pain, swelling, discoloration, and sores on the leg. Once identified, a DVT can be treated. It can also be prevented in some circumstances. Once you have had a DVT, you may be at increased risk for a DVT in the future. CAUSES Blood clots form in a vein for different reasons. Usually several things contribute to blood clots. Contributing factors include:  The flow of blood slows down.  The inside of the vein is damaged in some way.  The person has a condition that makes blood clot more easily. Some people are more likely than others to develop blood clots. That is because they have more factors that make clots likely. These are called risk factors. Risk factors include:   Older age, especially over 75 years old.  Having a history of blood clots. This means you have had one before. Or, it means that someone else in your family has had blood clots. You may have a genetic tendency to form clots.  Having major or lengthy surgery. This is especially true for surgery on the hip, knee, or belly (abdomen). Hip surgery is particularly high risk.  Breaking a hip or leg.  Sitting or lying still for a long time. This includes long distance travel, paralysis, or recovery from an illness or surgery.  Cancer, or cancer treatment.  Having a long, thin tube (catheter) placed inside a vein during a medical procedure.  Being overweight (obese).  Pregnancy and childbirth. Hormone  changes make the blood clot more easily during pregnancy. The fetus puts pressure on the veins of the pelvis. There is also risk of injury to veins during delivery or a caesarean. The risk is at its highest just after childbirth.  Medicines with the female hormone estrogen. This includes birth control pills and hormone replacement therapy.  Smoking.  Other circulation or heart problems. SYMPTOMS When a clot forms, it can either partially or totally block the blood flow in that vein. Symptoms of a DVT can include:  Swelling of the leg or arm, especially if one side is much worse.  Warmth and redness of the leg or arm, especially if one side is much worse.  Pain in an arm or leg. If the clot is in the leg, symptoms may be more noticeable or worse when standing or walking. The symptoms of a DVT that has traveled to the lungs (pulmonary embolism, PE) usually start suddenly, and include:  Shortness of breath.  Coughing.  Coughing up blood or blood-tinged phlegm.  Chest pain. The chest pain is often worse with deep breaths.  Rapid heartbeat. Anyone with these symptoms should get emergency medical treatment right away. Call your local emergency services (911 in [place] if you have these symptoms. DIAGNOSIS If a DVT is suspected, your caregiver will take a full medical history and carry out a physical exam. Tests that also may be required include:  Blood tests, including studies of   the clotting properties of the blood.  Ultrasonography to see if you have clots in your legs or lungs.  X-rays to show the flow of blood when dye is injected into the veins (venography).  Studies of your lungs, if you have any chest symptoms. PREVENTION  Exercise the legs regularly. Take a brisk 30 minute walk every day.  Maintain a weight that is appropriate for your height.  Avoid sitting or lying in bed for long periods of time without moving your legs.  Women, particularly those over the age of 35,  should consider the risks and benefits of taking estrogen medicines, including birth control pills.  Do not smoke, especially if you take estrogen medicines.  Long distance travel can increase your risk of DVT. You should exercise your legs by walking or pumping the muscles every hour.  In-hospital prevention:  Many of the risk factors above relate to situations that exist with hospitalization, either for illness, injury, or elective surgery.  Your caregiver will assess you for the need for venous thromboembolism prophylaxis when you are admitted to the hospital. If you are having surgery, your surgeon will assess you the day of or day after surgery.  Prevention may include medical and nonmedical measures. TREATMENT Treatment for DVT helps prevent death and disability. The most common treatment for DVT is blood thinning (anticoagulant) medicine, which reduces the blood's tendency to clot. Anticoagulants can stop new blood clots from forming and old ones from growing. They cannot dissolve existing clots. Your body does this by itself over time. Anticoagulants can be given by mouth, by intravenous (IV) access, or by injection. Your caregiver will determine the best program for you.  Heparin or related medicines (low molecular weight heparin) are usually the first treatment for a blood clot. They act quickly. However, they cannot be taken orally.  Heparin can cause a fall in a component of blood that stops bleeding and forms blood clots (platelets). You will be monitored with blood tests to be sure this does not occur.  Warfarin is an anticoagulant that can be swallowed (taken orally). It takes a few days to start working, so usually heparin or related medicines are used in combination. Once warfarin is working, heparin is usually stopped.  Less commonly, clot dissolving drugs (thrombolytics) are used to dissolve a DVT. They carry a high risk of bleeding, so they are used mainly in severe cases,  where a life or limb is threatened.  Very rarely, a blood clot in the leg needs to be removed surgically.  If you are unable to take anticoagulants, your caregiver may arrange for you to have a filter placed in a main vein in your belly (abdomen). This filter prevents clots from traveling to your lungs. HOME CARE INSTRUCTIONS  Take all medicines prescribed by your caregiver. Follow the directions carefully.  Warfarin. Most people will continue taking warfarin after hospital discharge. Your caregiver will advise you on the length of treatment (usually 3 6 months, sometimes lifelong).  Too much and too little warfarin are both dangerous. Too much warfarin increases the risk of bleeding. Too little warfarin continues to allow the risk for blood clots. While taking warfarin, you will need to have regular blood tests to measure your blood clotting time. These blood tests usually include both the prothrombin time (PT) and international normalized ratio (INR) tests. The PT and INR results allow your caregiver to adjust your dose of warfarin. The dose can change for many reasons. It is critically important that   you take warfarin exactly as prescribed, and that you have your PT and INR levels drawn exactly as directed.  Many foods, especially foods high in vitamin K can interfere with warfarin and affect the PT and INR results. Foods high in vitamin K include spinach, kale, broccoli, cabbage, collard and turnip greens, brussels sprouts, peas, cauliflower, seaweed, and parsley as well as beef and pork liver, green tea, and soybean oil. You should eat a consistent amount of foods high in vitamin K. Avoid major changes in your diet, or notify your caregiver before changing your diet. Arrange a visit with a dietitian to answer your questions.  Many medicines can interfere with warfarin and affect the PT and INR results. You must tell your caregiver about any and all medicines you take, this includes all vitamins  and supplements. Be especially cautious with aspirin and anti-inflammatory medicines. Ask your caregiver before taking these. Do not take or discontinue any prescribed or over-the-counter medicine except on the advice of your caregiver or pharmacist.  Warfarin can have side effects, primarily excessive bruising or bleeding. You will need to hold pressure over cuts for longer than usual. Your caregiver or pharmacist will discuss other potential side effects.  Alcohol can change the body's ability to handle warfarin. It is best to avoid alcoholic drinks or consume only very small amounts while taking warfarin. Notify your caregiver if you change your alcohol intake.  Notify your dentist or other caregivers before procedures.  Activity. Ask your caregiver how soon you can go back to normal activities. It is important to stay active to prevent blood clots. If you are on anticoagulant medicine, avoid contact sports.  Exercise. It is very important to exercise. This is especially important while traveling, sitting or standing for long periods of time. Exercise your legs by walking or by pumping the muscles frequently. Take frequent walks.  Compression stockings. These are tight elastic stockings that apply pressure to the lower legs. This pressure can help keep the blood in the legs from clotting. You may need to wear compressions stockings at home to help prevent a DVT.  Smoking. If you smoke, quit. Ask your caregiver for help with quitting smoking.  Learn as much as you can about DVT. Knowing more about the condition should help you keep it from coming back.  Wear a medical alert bracelet or carry a medical alert card. SEEK MEDICAL CARE IF:  You notice a rapid heartbeat.  You feel weaker or more tired than usual.  You feel faint.  You notice increased bruising.  You feel your symptoms are not getting better in the time expected.  You believe you are having side effects of medicine. SEEK  IMMEDIATE MEDICAL CARE IF:  You have chest pain.  You have trouble breathing.  You have new or increased swelling or pain in one leg.  You cough up blood.  You notice blood in vomit, in a bowel movement, or in urine. MAKE SURE YOU:  Understand these instructions.  Will watch your condition.  Will get help right away if you are not doing well or get worse. Document Released: 04/20/2005 Document Revised: 01/13/2012 Document Reviewed: 06/12/2010 [employer] Patient Information 2014 [employer].  

## 2012-12-16 ENCOUNTER — Encounter: Payer: Self-pay | Admitting: Family Medicine

## 2012-12-16 ENCOUNTER — Ambulatory Visit (INDEPENDENT_AMBULATORY_CARE_PROVIDER_SITE_OTHER): Payer: BC Managed Care – PPO | Admitting: Family Medicine

## 2012-12-16 ENCOUNTER — Other Ambulatory Visit: Payer: Self-pay | Admitting: Family Medicine

## 2012-12-16 ENCOUNTER — Ambulatory Visit (HOSPITAL_BASED_OUTPATIENT_CLINIC_OR_DEPARTMENT_OTHER)
Admission: RE | Admit: 2012-12-16 | Discharge: 2012-12-16 | Disposition: A | Discharge status: Home / Self Care | Payer: BC Managed Care – PPO | Source: Ambulatory Visit | Attending: Family Medicine | Admitting: Family Medicine

## 2012-12-16 VITALS — BP 122/76 | HR 77 | Temp 98.1°F | Wt 132.0 lb

## 2012-12-16 DIAGNOSIS — M79662 Pain in left lower leg: Secondary | ICD-10-CM

## 2012-12-16 DIAGNOSIS — M79609 Pain in unspecified limb: Secondary | ICD-10-CM | POA: Insufficient documentation

## 2012-12-16 NOTE — Patient Instructions (Signed)
Deep Vein Thrombosis A deep vein thrombosis (DVT) is a blood clot that develops in a deep vein. A DVT is a clot in the deep, larger veins of the leg, arm, or pelvis. These are more dangerous than clots that might form in veins near the surface of the body. A DVT can lead to complications if the clot breaks off and travels in the bloodstream to the lungs.  A DVT can damage the valves in your leg veins, so that instead of flowing upwards, the blood pools in the lower leg. This is called post-thrombotic syndrome, and can result in pain, swelling, discoloration, and sores on the leg. Once identified, a DVT can be treated. It can also be prevented in some circumstances. Once you have had a DVT, you may be at increased risk for a DVT in the future. CAUSES Blood clots form in a vein for different reasons. Usually several things contribute to blood clots. Contributing factors include:  The flow of blood slows down.  The inside of the vein is damaged in some way.  The person has a condition that makes blood clot more easily. Some people are more likely than others to develop blood clots. That is because they have more factors that make clots likely. These are called risk factors. Risk factors include:   Older age, especially over 75 years old.  Having a history of blood clots. This means you have had one before. Or, it means that someone else in your family has had blood clots. You may have a genetic tendency to form clots.  Having major or lengthy surgery. This is especially true for surgery on the hip, knee, or belly (abdomen). Hip surgery is particularly high risk.  Breaking a hip or leg.  Sitting or lying still for a long time. This includes long distance travel, paralysis, or recovery from an illness or surgery.  Cancer, or cancer treatment.  Having a long, thin tube (catheter) placed inside a vein during a medical procedure.  Being overweight (obese).  Pregnancy and childbirth. Hormone  changes make the blood clot more easily during pregnancy. The fetus puts pressure on the veins of the pelvis. There is also risk of injury to veins during delivery or a caesarean. The risk is at its highest just after childbirth.  Medicines with the female hormone estrogen. This includes birth control pills and hormone replacement therapy.  Smoking.  Other circulation or heart problems. SYMPTOMS When a clot forms, it can either partially or totally block the blood flow in that vein. Symptoms of a DVT can include:  Swelling of the leg or arm, especially if one side is much worse.  Warmth and redness of the leg or arm, especially if one side is much worse.  Pain in an arm or leg. If the clot is in the leg, symptoms may be more noticeable or worse when standing or walking. The symptoms of a DVT that has traveled to the lungs (pulmonary embolism, PE) usually start suddenly, and include:  Shortness of breath.  Coughing.  Coughing up blood or blood-tinged phlegm.  Chest pain. The chest pain is often worse with deep breaths.  Rapid heartbeat. Anyone with these symptoms should get emergency medical treatment right away. Call your local emergency services (911 in [place] if you have these symptoms. DIAGNOSIS If a DVT is suspected, your caregiver will take a full medical history and carry out a physical exam. Tests that also may be required include:  Blood tests, including studies of   the clotting properties of the blood.  Ultrasonography to see if you have clots in your legs or lungs.  X-rays to show the flow of blood when dye is injected into the veins (venography).  Studies of your lungs, if you have any chest symptoms. PREVENTION  Exercise the legs regularly. Take a brisk 30 minute walk every day.  Maintain a weight that is appropriate for your height.  Avoid sitting or lying in bed for long periods of time without moving your legs.  Women, particularly those over the age of 35,  should consider the risks and benefits of taking estrogen medicines, including birth control pills.  Do not smoke, especially if you take estrogen medicines.  Long distance travel can increase your risk of DVT. You should exercise your legs by walking or pumping the muscles every hour.  In-hospital prevention:  Many of the risk factors above relate to situations that exist with hospitalization, either for illness, injury, or elective surgery.  Your caregiver will assess you for the need for venous thromboembolism prophylaxis when you are admitted to the hospital. If you are having surgery, your surgeon will assess you the day of or day after surgery.  Prevention may include medical and nonmedical measures. TREATMENT Treatment for DVT helps prevent death and disability. The most common treatment for DVT is blood thinning (anticoagulant) medicine, which reduces the blood's tendency to clot. Anticoagulants can stop new blood clots from forming and old ones from growing. They cannot dissolve existing clots. Your body does this by itself over time. Anticoagulants can be given by mouth, by intravenous (IV) access, or by injection. Your caregiver will determine the best program for you.  Heparin or related medicines (low molecular weight heparin) are usually the first treatment for a blood clot. They act quickly. However, they cannot be taken orally.  Heparin can cause a fall in a component of blood that stops bleeding and forms blood clots (platelets). You will be monitored with blood tests to be sure this does not occur.  Warfarin is an anticoagulant that can be swallowed (taken orally). It takes a few days to start working, so usually heparin or related medicines are used in combination. Once warfarin is working, heparin is usually stopped.  Less commonly, clot dissolving drugs (thrombolytics) are used to dissolve a DVT. They carry a high risk of bleeding, so they are used mainly in severe cases,  where a life or limb is threatened.  Very rarely, a blood clot in the leg needs to be removed surgically.  If you are unable to take anticoagulants, your caregiver may arrange for you to have a filter placed in a main vein in your belly (abdomen). This filter prevents clots from traveling to your lungs. HOME CARE INSTRUCTIONS  Take all medicines prescribed by your caregiver. Follow the directions carefully.  Warfarin. Most people will continue taking warfarin after hospital discharge. Your caregiver will advise you on the length of treatment (usually 3 6 months, sometimes lifelong).  Too much and too little warfarin are both dangerous. Too much warfarin increases the risk of bleeding. Too little warfarin continues to allow the risk for blood clots. While taking warfarin, you will need to have regular blood tests to measure your blood clotting time. These blood tests usually include both the prothrombin time (PT) and international normalized ratio (INR) tests. The PT and INR results allow your caregiver to adjust your dose of warfarin. The dose can change for many reasons. It is critically important that   you take warfarin exactly as prescribed, and that you have your PT and INR levels drawn exactly as directed.  Many foods, especially foods high in vitamin K can interfere with warfarin and affect the PT and INR results. Foods high in vitamin K include spinach, kale, broccoli, cabbage, collard and turnip greens, brussels sprouts, peas, cauliflower, seaweed, and parsley as well as beef and pork liver, green tea, and soybean oil. You should eat a consistent amount of foods high in vitamin K. Avoid major changes in your diet, or notify your caregiver before changing your diet. Arrange a visit with a dietitian to answer your questions.  Many medicines can interfere with warfarin and affect the PT and INR results. You must tell your caregiver about any and all medicines you take, this includes all vitamins  and supplements. Be especially cautious with aspirin and anti-inflammatory medicines. Ask your caregiver before taking these. Do not take or discontinue any prescribed or over-the-counter medicine except on the advice of your caregiver or pharmacist.  Warfarin can have side effects, primarily excessive bruising or bleeding. You will need to hold pressure over cuts for longer than usual. Your caregiver or pharmacist will discuss other potential side effects.  Alcohol can change the body's ability to handle warfarin. It is best to avoid alcoholic drinks or consume only very small amounts while taking warfarin. Notify your caregiver if you change your alcohol intake.  Notify your dentist or other caregivers before procedures.  Activity. Ask your caregiver how soon you can go back to normal activities. It is important to stay active to prevent blood clots. If you are on anticoagulant medicine, avoid contact sports.  Exercise. It is very important to exercise. This is especially important while traveling, sitting or standing for long periods of time. Exercise your legs by walking or by pumping the muscles frequently. Take frequent walks.  Compression stockings. These are tight elastic stockings that apply pressure to the lower legs. This pressure can help keep the blood in the legs from clotting. You may need to wear compressions stockings at home to help prevent a DVT.  Smoking. If you smoke, quit. Ask your caregiver for help with quitting smoking.  Learn as much as you can about DVT. Knowing more about the condition should help you keep it from coming back.  Wear a medical alert bracelet or carry a medical alert card. SEEK MEDICAL CARE IF:  You notice a rapid heartbeat.  You feel weaker or more tired than usual.  You feel faint.  You notice increased bruising.  You feel your symptoms are not getting better in the time expected.  You believe you are having side effects of medicine. SEEK  IMMEDIATE MEDICAL CARE IF:  You have chest pain.  You have trouble breathing.  You have new or increased swelling or pain in one leg.  You cough up blood.  You notice blood in vomit, in a bowel movement, or in urine. MAKE SURE YOU:  Understand these instructions.  Will watch your condition.  Will get help right away if you are not doing well or get worse. Document Released: 04/20/2005 Document Revised: 01/13/2012 Document Reviewed: 06/12/2010 [employer] Patient Information 2014 [employer].  

## 2012-12-19 NOTE — Progress Notes (Signed)
  Subjective:     Carrie Morrison is a 32 y.o. female who presents for evaluation of swelling and pain of the left leg. Symptoms have been present for 1 day. She has not had similar problems in the past. The patient is able to ambulate. Risk factors for hypercoagulable state include: dog jump on her leg and knocked her leg out.  Pt c/o thigh and calf pain.  The following portions of the patient's history were reviewed and updated as appropriate: allergies, current medications, past family history, past medical history, past social history, past surgical history and problem list.  Review of Systems Pertinent items are noted in HPI.    Objective:    BP 122/76  Pulse 77  Temp(Src) 98.1 F (36.7 C) (Oral)  Wt 132 lb (59.875 kg)  BMI 22.65 kg/m2  SpO2 98% General appearance: alert, cooperative, appears stated age and no distress Neck: no adenopathy, supple, symmetrical, trachea midline and thyroid not enlarged, symmetric, no tenderness/mass/nodules Lungs: clear to auscultation bilaterally Heart: S1, S2 normal Extremities: L calf and thigh pain, no knee swelling    Assessment:     calf pain:  ? DVT.    Plan:    Leg elevation. doppler LE--- neg for DVT  Ice alt heat Refer sports med if no relief in 1-2 weeks or if symptoms worsen

## 2013-05-16 ENCOUNTER — Ambulatory Visit: Payer: BC Managed Care – PPO | Admitting: Family Medicine

## 2013-07-21 ENCOUNTER — Ambulatory Visit (INDEPENDENT_AMBULATORY_CARE_PROVIDER_SITE_OTHER): Payer: BC Managed Care – PPO | Admitting: Gynecology

## 2013-07-21 ENCOUNTER — Encounter: Payer: Self-pay | Admitting: Gynecology

## 2013-07-21 DIAGNOSIS — L259 Unspecified contact dermatitis, unspecified cause: Secondary | ICD-10-CM

## 2013-07-21 DIAGNOSIS — L309 Dermatitis, unspecified: Secondary | ICD-10-CM

## 2013-07-21 MED ORDER — NYSTATIN-TRIAMCINOLONE 100000-0.1 UNIT/GM-% EX OINT: 1.0000 "application " | TOPICAL_OINTMENT | Freq: Four times a day (QID) | CUTANEOUS | Status: DC

## 2013-07-21 NOTE — Progress Notes (Signed)
Carrie Morrison 02-Jan-1981 [id number]        32 y.o.  G1P0101 presents complaining of an itchy left nipple since the winter. No discharge bleeding skin cracking or other symptoms. She does feel like the skin around the areola has changed her inspection. No masses or other abnormalities. No history of the same before.  Past medical history,surgical history, problem list, medications, allergies, family history and social history were all reviewed and documented in the EPIC chart.  Exam: Kim assistant General appearance  Normal Both breasts examined lying and sitting. No masses, retractions, discharge or adenopathy. The left nipple is normal to inspection without evidence of dermatitis or drainage.  Assessment/Plan:  33 y.o. G1P0101 left nipple pruritus. Exam is unremarkable. Suspect probably fungal infection. Patient is allergic to fluconazole with throat tightening. Unsure of other available oral anti-fungal agents that can be used we'll start with Mytrex topical cream 4 times a day. I'll check with the pharmacy to see if there is an oral agent that we can use. I did recommend that the patient followup with an allergist for testing as far azole medications. There is some confusion as to whether really it was latex exposure at the time she also took the Diflucan and this is really not allergic to Diflucan. This certainly would be a good piece of information and she agrees to follow up with the allergist. She'll follow up with me if her itching continues. She is overdue for her annual exam and has it scheduled in April.   Note: This document was prepared with digital dictation and possible smart phrase technology. Any transcriptional errors that result from this process are unintentional.   Anastasio Auerbach MD, 11:43 AM 07/21/2013

## 2013-07-21 NOTE — Patient Instructions (Signed)
Apply prescribed cream 4 times daily to the nipple. Followup if symptoms persist. Followup for your annual exam in April.

## 2013-08-30 ENCOUNTER — Encounter: Payer: Self-pay | Admitting: Gynecology

## 2013-09-05 ENCOUNTER — Encounter: Payer: Self-pay | Admitting: Gynecology

## 2013-09-05 ENCOUNTER — Ambulatory Visit (INDEPENDENT_AMBULATORY_CARE_PROVIDER_SITE_OTHER): Payer: BC Managed Care – PPO | Admitting: Gynecology

## 2013-09-05 VITALS — BP 116/74 | Ht 63.0 in | Wt 132.0 lb

## 2013-09-05 DIAGNOSIS — N926 Irregular menstruation, unspecified: Secondary | ICD-10-CM

## 2013-09-05 DIAGNOSIS — Z01419 Encounter for gynecological examination (general) (routine) without abnormal findings: Secondary | ICD-10-CM

## 2013-09-05 LAB — CBC WITH DIFFERENTIAL/PLATELET
BASOS ABS: 0.1 10*3/uL (ref 0.0–0.1)
BASOS PCT: 1 % (ref 0–1)
EOS PCT: 3 % (ref 0–5)
Eosinophils Absolute: 0.2 10*3/uL (ref 0.0–0.7)
HCT: 42.1 % (ref 36.0–46.0)
Hemoglobin: 14.2 g/dL (ref 12.0–15.0)
LYMPHS PCT: 31 % (ref 12–46)
Lymphs Abs: 1.6 10*3/uL (ref 0.7–4.0)
MCH: 32.1 pg (ref 26.0–34.0)
MCHC: 33.7 g/dL (ref 30.0–36.0)
MCV: 95.2 fL (ref 78.0–100.0)
Monocytes Absolute: 0.4 10*3/uL (ref 0.1–1.0)
Monocytes Relative: 7 % (ref 3–12)
Neutro Abs: 2.9 10*3/uL (ref 1.7–7.7)
Neutrophils Relative %: 58 % (ref 43–77)
PLATELETS: 331 10*3/uL (ref 150–400)
RBC: 4.42 MIL/uL (ref 3.87–5.11)
RDW: 12.2 % (ref 11.5–15.5)
WBC: 5 10*3/uL (ref 4.0–10.5)

## 2013-09-05 NOTE — Patient Instructions (Signed)
Follow up for ultrasound as scheduled.  You may obtain a copy of any labs that were done today by logging onto MyChart as outlined in the instructions provided with your AVS (after visit summary). The office will not call with normal lab results but certainly if there are any significant abnormalities then we will contact you.   Health Maintenance, Female A healthy lifestyle and preventative care can promote health and wellness.  Maintain regular health, dental, and eye exams.  Eat a healthy diet. Foods like vegetables, fruits, whole grains, low-fat dairy products, and lean protein foods contain the nutrients you need without too many calories. Decrease your intake of foods high in solid fats, added sugars, and salt. Get information about a proper diet from your caregiver, if necessary.  Regular physical exercise is one of the most important things you can do for your health. Most adults should get at least 150 minutes of moderate-intensity exercise (any activity that increases your heart rate and causes you to sweat) each week. In addition, most adults need muscle-strengthening exercises on 2 or more days a week.   Maintain a healthy weight. The body mass index (BMI) is a screening tool to identify possible weight problems. It provides an estimate of body fat based on height and weight. Your caregiver can help determine your BMI, and can help you achieve or maintain a healthy weight. For adults 20 years and older:  A BMI below 18.5 is considered underweight.  A BMI of 18.5 to 24.9 is normal.  A BMI of 25 to 29.9 is considered overweight.  A BMI of 30 and above is considered obese.  Maintain normal blood lipids and cholesterol by exercising and minimizing your intake of saturated fat. Eat a balanced diet with plenty of fruits and vegetables. Blood tests for lipids and cholesterol should begin at age 20 and be repeated every 5 years. If your lipid or cholesterol levels are high, you are over  50, or you are a high risk for heart disease, you may need your cholesterol levels checked more frequently.Ongoing high lipid and cholesterol levels should be treated with medicines if diet and exercise are not effective.  If you smoke, find out from your caregiver how to quit. If you do not use tobacco, do not start.  Lung cancer screening is recommended for adults aged 55 80 years who are at high risk for developing lung cancer because of a history of smoking. Yearly low-dose computed tomography (CT) is recommended for people who have at least a 30-pack-year history of smoking and are a current smoker or have quit within the past 15 years. A pack year of smoking is smoking an average of 1 pack of cigarettes a day for 1 year (for example: 1 pack a day for 30 years or 2 packs a day for 15 years). Yearly screening should continue until the smoker has stopped smoking for at least 15 years. Yearly screening should also be stopped for people who develop a health problem that would prevent them from having lung cancer treatment.  If you are pregnant, do not drink alcohol. If you are breastfeeding, be very cautious about drinking alcohol. If you are not pregnant and choose to drink alcohol, do not exceed 1 drink per day. One drink is considered to be 12 ounces (355 mL) of beer, 5 ounces (148 mL) of wine, or 1.5 ounces (44 mL) of liquor.  Avoid use of street drugs. Do not share needles with anyone. Ask for help if   need support or instructions about stopping the use of drugs.  High blood pressure causes heart disease and increases the risk of stroke. Blood pressure should be checked at least every 1 to 2 years. Ongoing high blood pressure should be treated with medicines, if weight loss and exercise are not effective.  If you are 56 to 34 years old, ask your caregiver if you should take aspirin to prevent strokes.  Diabetes screening involves taking a blood sample to check your fasting blood sugar level.  This should be done once every 3 years, after age 70, if you are within normal weight and without risk factors for diabetes. Testing should be considered at a younger age or be carried out more frequently if you are overweight and have at least 1 risk factor for diabetes.  Breast cancer screening is essential preventative care for women. You should practice "breast self-awareness." This means understanding the normal appearance and feel of your breasts and may include breast self-examination. Any changes detected, no matter how small, should be reported to a caregiver. Women in their 40s and 30s should have a clinical breast exam (CBE) by a caregiver as part of a regular health exam every 1 to 3 years. After age 69, women should have a CBE every year. Starting at age 28, women should consider having a mammogram (breast X-ray) every year. Women who have a family history of breast cancer should talk to their caregiver about genetic screening. Women at a high risk of breast cancer should talk to their caregiver about having an MRI and a mammogram every year.  Breast cancer gene (BRCA)-related cancer risk assessment is recommended for women who have family members with BRCA-related cancers. BRCA-related cancers include breast, ovarian, tubal, and peritoneal cancers. Having family members with these cancers may be associated with an increased risk for harmful changes (mutations) in the breast cancer genes BRCA1 and BRCA2. Results of the assessment will determine the need for genetic counseling and BRCA1 and BRCA2 testing.  The Pap test is a screening test for cervical cancer. Women should have a Pap test starting at age 47. Between ages 57 and 34, Pap tests should be repeated every 2 years. Beginning at age 72, you should have a Pap test every 3 years as long as the past 3 Pap tests have been normal. If you had a hysterectomy for a problem that was not cancer or a condition that could lead to cancer, then you no  longer need Pap tests. If you are between ages 74 and 63, and you have had normal Pap tests going back 10 years, you no longer need Pap tests. If you have had past treatment for cervical cancer or a condition that could lead to cancer, you need Pap tests and screening for cancer for at least 20 years after your treatment. If Pap tests have been discontinued, risk factors (such as a new sexual partner) need to be reassessed to determine if screening should be resumed. Some women have medical problems that increase the chance of getting cervical cancer. In these cases, your caregiver may recommend more frequent screening and Pap tests.  The human papillomavirus (HPV) test is an additional test that may be used for cervical cancer screening. The HPV test looks for the virus that can cause the cell changes on the cervix. The cells collected during the Pap test can be tested for HPV. The HPV test could be used to screen women aged 66 years and older, and should be  be used in women of any age who have unclear Pap test results. After the age of 30, women should have HPV testing at the same frequency as a Pap test.  Colorectal cancer can be detected and often prevented. Most routine colorectal cancer screening begins at the age of 50 and continues through age 75. However, your caregiver may recommend screening at an earlier age if you have risk factors for colon cancer. On a yearly basis, your caregiver may provide home test kits to check for hidden blood in the stool. Use of a small camera at the end of a tube, to directly examine the colon (sigmoidoscopy or colonoscopy), can detect the earliest forms of colorectal cancer. Talk to your caregiver about this at age 50, when routine screening begins. Direct examination of the colon should be repeated every 5 to 10 years through age 75, unless early forms of pre-cancerous polyps or small growths are found.  Hepatitis C blood testing is recommended for all people born from  1945 through 1965 and any individual with known risks for hepatitis C.  Practice safe sex. Use condoms and avoid high-risk sexual practices to reduce the spread of sexually transmitted infections (STIs). Sexually active women aged 25 and younger should be checked for Chlamydia, which is a common sexually transmitted infection. Older women with new or multiple partners should also be tested for Chlamydia. Testing for other STIs is recommended if you are sexually active and at increased risk.  Osteoporosis is a disease in which the bones lose minerals and strength with aging. This can result in serious bone fractures. The risk of osteoporosis can be identified using a bone density scan. Women ages 65 and over and women at risk for fractures or osteoporosis should discuss screening with their caregivers. Ask your caregiver whether you should be taking a calcium supplement or vitamin D to reduce the rate of osteoporosis.  Menopause can be associated with physical symptoms and risks. Hormone replacement therapy is available to decrease symptoms and risks. You should talk to your caregiver about whether hormone replacement therapy is right for you.  Use sunscreen. Apply sunscreen liberally and repeatedly throughout the day. You should seek shade when your shadow is shorter than you. Protect yourself by wearing long sleeves, pants, a wide-brimmed hat, and sunglasses year round, whenever you are outdoors.  Notify your caregiver of new moles or changes in moles, especially if there is a change in shape or color. Also notify your caregiver if a mole is larger than the size of a pencil eraser.  Stay current with your immunizations. Document Released: 11/03/2010 Document Revised: 08/15/2012 Document Reviewed: 11/03/2010 [employer] Patient Information 2014 [employer].   

## 2013-09-05 NOTE — Progress Notes (Signed)
Carrie Morrison [birth year] [id number]        32 y.o.  G1P0101 for annual exam.  Several issues noted below.  Past medical history,surgical history, problem list, medications, allergies, family history and social history were all reviewed and documented as reviewed in the EPIC chart.  ROS:  12 system ROS performed with pertinent positives and negatives included in the history, assessment and plan.  Included Systems: General, HEENT, Neck, Cardiovascular, Pulmonary, Gastrointestinal, Genitourinary, Musculoskeletal, Dermatologic, Endocrine, Hematological, Neurologic, Psychiatric Additional significant findings :  None   Exam: Kim assistant Filed Vitals:   09/05/13 1006  BP: 116/74  Height: 5\' 3"  (1.6 m)  Weight: 132 lb (59.875 kg)   General appearance:  Normal affect, orientation and appearance. Skin: Grossly normal HEENT: Without gross lesions.  No cervical or supraclavicular adenopathy. Thyroid normal.  Lungs:  Clear without wheezing, rales or rhonchi Cardiac: RR, without RMG Abdominal:  Soft, nontender, without masses, guarding, rebound, organomegaly or hernia Breasts:  Examined lying and sitting without masses, retractions, discharge or axillary adenopathy. Pelvic:  Ext/BUS/vagina normal  Cervix normal  Uterus anteverted, normal size, shape and contour, midline and mobile nontender   Adnexa  Without masses or tenderness    Anus and perineum  Normal   Rectovaginal  Normal sphincter tone without palpated masses or tenderness.    Assessment/Plan:  33 y.o. G67P0101 female for annual exam with regular menses, no contraception.   1. Breakthrough bleeding. Patient notes almost monthly she will have some breakthrough bleeding throughout the month. This usually is associated with a running. Has regular monthly menses. Some cramping when she has the bleeding. We'll check baseline labs to include TSH prolactin and schedule sonohysterogram. 2. Contraception. Not actively trying but not  preventing. Would accept the pregnancy if it occurred. Recommended prenatal vitamin now preconceptionally. We previously discussed for preterm delivery at 29 weeks. It was due to IUGR which was felt to be secondary to a leiomyoma compromising placental function. Interestingly she has no evidence of leiomyoma on followup ultrasound. I had previously discussed options to followup with obstetrician/perinatologist preconceptionally but she has not done. 3. Breast health. Prior nipple pruritus. Symptoms have resolved. We'll monitor and represent if recurs. Plan mammography closer to 39. SBE monthly reviewed. 4. Pap smear/HPV negative 03/2012. No Pap smear done today. Plan repeat next year at three-year interval. 5. Health maintenance. Patient asked if I would do her baseline lab work. CBC comprehensive metabolic panel lipid profile urinalysis ordered along with her TSH prolactin. Followup for sonohysterogram as scheduled.   Note: This document was prepared with digital dictation and possible smart phrase technology. Any transcriptional errors that result from this process are unintentional.   Anastasio Auerbach MD, 10:34 AM 09/05/2013

## 2013-09-06 LAB — LIPID PANEL
Cholesterol: 206 mg/dL — ABNORMAL HIGH (ref 0–200)
HDL: 91 mg/dL (ref >39)
LDL CALC: 106 mg/dL — AB (ref 0–99)
Total CHOL/HDL Ratio: 2.3 Ratio
Triglycerides: 46 mg/dL (ref <150)
VLDL: 9 mg/dL (ref 0–40)

## 2013-09-06 LAB — URINALYSIS W MICROSCOPIC + REFLEX CULTURE
BILIRUBIN URINE: NEGATIVE
CASTS: NONE SEEN
GLUCOSE, UA: NEGATIVE mg/dL
HGB URINE DIPSTICK: NEGATIVE
KETONES UR: NEGATIVE mg/dL
Leukocytes, UA: NEGATIVE
Nitrite: NEGATIVE
PH: 7 (ref 5.0–8.0)
Protein, ur: NEGATIVE mg/dL
Specific Gravity, Urine: 1.025 (ref 1.005–1.030)
Urobilinogen, UA: 0.2 mg/dL (ref 0.0–1.0)

## 2013-09-06 LAB — COMPREHENSIVE METABOLIC PANEL
ALK PHOS: 59 U/L (ref 39–117)
ALT: 12 U/L (ref 0–35)
AST: 16 U/L (ref 0–37)
Albumin: 4.6 g/dL (ref 3.5–5.2)
BILIRUBIN TOTAL: 1.8 mg/dL — AB (ref 0.2–1.2)
BUN: 12 mg/dL (ref 6–23)
CALCIUM: 9.8 mg/dL (ref 8.4–10.5)
CHLORIDE: 101 meq/L (ref 96–112)
CO2: 26 mEq/L (ref 19–32)
CREATININE: 0.77 mg/dL (ref 0.50–1.10)
Glucose, Bld: 93 mg/dL (ref 70–99)
Potassium: 4.1 mEq/L (ref 3.5–5.3)
Sodium: 136 mEq/L (ref 135–145)
Total Protein: 7.1 g/dL (ref 6.0–8.3)

## 2013-09-06 LAB — TSH: TSH: 1.474 u[IU]/mL (ref 0.350–4.500)

## 2013-09-06 LAB — PROLACTIN: PROLACTIN: 12.3 ng/mL

## 2013-09-20 ENCOUNTER — Ambulatory Visit (INDEPENDENT_AMBULATORY_CARE_PROVIDER_SITE_OTHER): Payer: BC Managed Care – PPO

## 2013-09-20 ENCOUNTER — Encounter: Payer: Self-pay | Admitting: Family Medicine

## 2013-09-20 ENCOUNTER — Ambulatory Visit (INDEPENDENT_AMBULATORY_CARE_PROVIDER_SITE_OTHER)
Admission: RE | Admit: 2013-09-20 | Discharge: 2013-09-20 | Disposition: A | Discharge status: Home / Self Care | Payer: BC Managed Care – PPO | Source: Ambulatory Visit | Attending: Family Medicine | Admitting: Family Medicine

## 2013-09-20 ENCOUNTER — Ambulatory Visit (INDEPENDENT_AMBULATORY_CARE_PROVIDER_SITE_OTHER): Payer: BC Managed Care – PPO | Admitting: Family Medicine

## 2013-09-20 ENCOUNTER — Ambulatory Visit (HOSPITAL_COMMUNITY)
Admission: RE | Admit: 2013-09-20 | Discharge: 2013-09-20 | Disposition: A | Discharge status: Home / Self Care | Payer: BC Managed Care – PPO | Source: Ambulatory Visit | Attending: Family Medicine | Admitting: Family Medicine

## 2013-09-20 VITALS — BP 120/74 | HR 93 | Temp 98.2°F | Wt 132.0 lb

## 2013-09-20 VITALS — BP 110/72 | HR 96 | Temp 100.6°F | Wt 132.0 lb

## 2013-09-20 DIAGNOSIS — M79662 Pain in left lower leg: Secondary | ICD-10-CM

## 2013-09-20 DIAGNOSIS — M25559 Pain in unspecified hip: Secondary | ICD-10-CM

## 2013-09-20 DIAGNOSIS — M79652 Pain in left thigh: Secondary | ICD-10-CM

## 2013-09-20 DIAGNOSIS — M79609 Pain in unspecified limb: Secondary | ICD-10-CM | POA: Insufficient documentation

## 2013-09-20 DIAGNOSIS — J069 Acute upper respiratory infection, unspecified: Secondary | ICD-10-CM

## 2013-09-20 MED ORDER — LORATADINE 10 MG PO TABS: 10.0000 mg | ORAL_TABLET | Freq: Every day | ORAL | Status: DC

## 2013-09-20 MED ORDER — MELOXICAM 15 MG PO TABS: 15.0000 mg | ORAL_TABLET | Freq: Every day | ORAL | Status: DC

## 2013-09-20 MED ORDER — TRIAMCINOLONE ACETONIDE 55 MCG/ACT NA AERO: 2.0000 | INHALATION_SPRAY | Freq: Every day | NASAL | Status: DC

## 2013-09-20 NOTE — Progress Notes (Signed)
Pre visit review using our clinic review tool, if applicable. No additional management support is needed unless otherwise documented below in the visit note. 

## 2013-09-20 NOTE — Assessment & Plan Note (Signed)
The patient's pain in limb does have quite a differential. On ultrasound today and physical exam there appears to be some type of soft tissue fullness and appears to be potentially muscular in nature. This could be a muscle injury and just variant from the contralateral side. Patient's differential also includes stress fractures we will get an x-ray to see if there is any bony deformity. Patient is concerned because patient does have a family history of blood clots and cardiovascular risk with multiple individuals dying in their 49s. I do think ruling out a clog would be a good idea for patient's well-being and we'll order a Doppler today. Patient is going to get a compression sleeve, icing and will do anti-inflammatories. Due to patient even being sensitive to light touch shingles is also in the differential. Patient is awaiting of any type of rash occurs. Patient will come back in one week for further followup. Depending on findings patient may need further advanced imaging to further delineate if this is a soft tissue mass.

## 2013-09-20 NOTE — Patient Instructions (Signed)

## 2013-09-20 NOTE — Progress Notes (Signed)
  Subjective:     Marchella Hibbard is a 33 y.o. female who presents for evaluation of sinus pain. Symptoms include: clear rhinorrhea, nasal congestion, post nasal drip and sore throat. Onset of symptoms was 1 day ago. Symptoms have been unchanged since that time. Past history is significant for no history of pneumonia or bronchitis. Patient is a non-smoker.  Pt also c/o L thigh pain x 1 week.  Pt denies any injury.  Painful to touch.  She is able to walk without significant discomfort.  No bruising, no fevers.    The following portions of the patient's history were reviewed and updated as appropriate: allergies, current medications, past family history, past medical history, past social history, past surgical history and problem list.  Review of Systems Pertinent items are noted in HPI.   Objective:    BP 120/74  Pulse 93  Temp(Src) 98.2 F (36.8 C) (Oral)  Wt 132 lb (59.875 kg)  SpO2 97%  LMP 08/20/2013 General appearance: alert, cooperative, appears stated age and no distress Ears: normal TM's and external ear canals both ears Nose: clear discharge, mild congestion, turbinates red, swollen, no sinus tenderness Throat: pnd Neck: no adenopathy, supple, symmetrical, trachea midline and thyroid not enlarged, symmetric, no tenderness/mass/nodules Lungs: clear to auscultation bilaterally Heart: S1, S2 normal Extremities: + pain L thigh with swelling , no errythema,  no hot to touch    Assessment:    Acute viral sinusitis.    Plan:    Nasal steroids per medication orders. Antihistamines per medication orders. rto prn   1. Acute upper respiratory infections of unspecified site   - loratadine (CLARITIN) 10 MG tablet; Take 1 tablet (10 mg total) by mouth daily.  Dispense: 30 tablet; Refill: 11 - triamcinolone (NASACORT ALLERGY 24HR) 55 MCG/ACT AERO nasal inhaler; Place 2 sprays into the nose daily.  Dispense: 1 Inhaler; Refill: 12  2. Left thigh pain To see sports med today - CBC  with Differential - Sedimentation rate - CK (Creatine Kinase) - Ambulatory referral to Sports Medicine - meloxicam (MOBIC) 15 MG tablet; Take 1 tablet (15 mg total) by mouth daily.  Dispense: 30 tablet; Refill: 0

## 2013-09-20 NOTE — Patient Instructions (Signed)
Good to meet you both Xray downstairs today Doppler soon.  Ice 20 minutes 2 times daily.  Meloxicam daily for next 10 days  Come back in 1 week.

## 2013-09-20 NOTE — Progress Notes (Signed)
VASCULAR LAB PRELIMINARY  PRELIMINARY  PRELIMINARY  PRELIMINARY  Left lower extremity venous duplex completed.    Preliminary report:  Left:  No evidence of DVT, superficial thrombosis, or Baker's cyst.  [place], RVS 09/20/2013, 6:27 PM

## 2013-09-20 NOTE — Progress Notes (Signed)
[name] [place], [place] [address] Phone: [phone] Subjective:    I'm seeing this patient by the request  of:  Garnet Koyanagi, DO   CC: Left leg pain and swelling  INO:MVEHMCNOBS Carrie Morrison is a 33 y.o. female coming in with complaint of left leg pain and swelling. Patient states that this pain has started to hurt over the course last 1-2 weeks. Patient does not remember any true injury. Patient states it hurts mostly on the anterior aspect of thigh it hurts more to palpation and the same running or walking. Patient is an avid runner and states that she has been able to continue to run 20-40 miles a week. Patient has tried some ibuprofen and Tylenol with no significant benefit. Patient is also having a recent cold and does have a fever today. Patient does have a family history of blood clots and is concerned that she has one. Patient denies any rash. Patient also denies any shortness of breath. Patient rates the severity pain is 8/10.      Past medical history, social, surgical and family history all reviewed in electronic medical record.   Review of Systems: No headache, visual changes, nausea, vomiting, diarrhea, constipation, dizziness, abdominal pain, skin rash, fevers, chills, night sweats, weight loss, swollen lymph nodes, body aches, joint swelling, muscle aches, chest pain, shortness of breath, mood changes.   Objective Blood pressure 110/72, pulse 96, temperature 100.6 F (38.1 C), temperature source Oral, weight 132 lb (59.875 kg), last menstrual period 08/20/2013, SpO2 97.00%.  General: No apparent distress alert and oriented x3 mood and affect normal, dressed appropriately patient does appear ill.  HEENT: Pupils equal, extraocular movements intact  Respiratory: Patient's speak in full sentences and does not appear short of breath  Cardiovascular: No lower extremity edema, non tender, no erythema  Skin: Warm dry intact with no  signs of infection or rash on extremities or on axial skeleton.  Abdomen: Soft nontender  Neuro: Cranial nerves II through XII are intact, neurovascularly intact in all extremities with 2+ DTRs and 2+ pulses.  Lymph: No lymphadenopathy of posterior or anterior cervical chain or axillae bilaterally.  Gait normal with good balance and coordination.  MSK:  Non tender with full range of motion and good stability and symmetric strength and tone of shoulders, elbows, wrist and ankles bilaterally.   Hip: Left ROM IR: 45 Deg, ER: 45 Deg, Flexion: 120 Deg, Extension: 100 Deg, Abduction: 45 Deg, Adduction: 45 Deg Strength IR: 5/5, ER: 5/5, Flexion: 5/5, Extension: 5/5, Abduction: 5/5, Adduction: 5/5 Pelvic alignment unremarkable to inspection and palpation. Standing hip rotation and gait without trendelenburg sign / unsteadiness. Greater trochanter with minimal tenderness to palpation. No tenderness over piriformis  No pain with FABER or FADIR. No SI joint tenderness and normal minimal SI movement. Patient has a negative fulcrum test. Patient is tender to palpation even to light sensation over the anterior aspect of the thigh approximately 9 cm from the knee. Patient's knee has full range of motion and is nontender. Neurovascularly intact distally with 2+ DTRs. Contralateral hip is unremarkable  Limited muscular skeletal ultrasound was performed Lyndal Pulley Patient's ultrasound of the left femur shows that patient's rectus femoris just deep to this muscle has an area that appears to have striation. It does have significant hypoechoic changes surrounding it and with compression it does move such as the cyst but has not had the inherent tonicity of a lipoma it is not  hypoechoic like a cyst. This is tender to palpation in this area to patient as well. This measures approximately 4 cm in diameter. When comparing the contralateral leg this is not present.  Impression: Soft tissue mass questionable  lipoma versus ganglion cyst versus sarcoma.   Impression and Recommendations:     This case required medical decision making of moderate complexity.

## 2013-09-21 ENCOUNTER — Ambulatory Visit: Payer: BC Managed Care – PPO | Admitting: Family Medicine

## 2013-09-22 ENCOUNTER — Ambulatory Visit: Payer: BC Managed Care – PPO | Admitting: Family Medicine

## 2013-09-24 ENCOUNTER — Emergency Department (INDEPENDENT_AMBULATORY_CARE_PROVIDER_SITE_OTHER)
Admission: EM | Admit: 2013-09-24 | Discharge: 2013-09-24 | Disposition: A | Discharge status: Home / Self Care | Payer: BC Managed Care – PPO | Source: Home / Self Care

## 2013-09-24 ENCOUNTER — Encounter (HOSPITAL_COMMUNITY): Payer: Self-pay | Admitting: Emergency Medicine

## 2013-09-24 DIAGNOSIS — R509 Fever, unspecified: Secondary | ICD-10-CM

## 2013-09-24 DIAGNOSIS — J309 Allergic rhinitis, unspecified: Secondary | ICD-10-CM

## 2013-09-24 DIAGNOSIS — B349 Viral infection, unspecified: Secondary | ICD-10-CM

## 2013-09-24 DIAGNOSIS — B9789 Other viral agents as the cause of diseases classified elsewhere: Secondary | ICD-10-CM

## 2013-09-24 LAB — CBC WITH DIFFERENTIAL/PLATELET
BASOS PCT: 0 % (ref 0–1)
Basophils Absolute: 0.1 10*3/uL (ref 0.0–0.1)
Eosinophils Absolute: 0.4 10*3/uL (ref 0.0–0.7)
Eosinophils Relative: 3 % (ref 0–5)
HEMATOCRIT: 41.7 % (ref 36.0–46.0)
Hemoglobin: 14.3 g/dL (ref 12.0–15.0)
LYMPHS ABS: 2.4 10*3/uL (ref 0.7–4.0)
Lymphocytes Relative: 21 % (ref 12–46)
MCH: 33.2 pg (ref 26.0–34.0)
MCHC: 34.3 g/dL (ref 30.0–36.0)
MCV: 96.8 fL (ref 78.0–100.0)
MONO ABS: 0.9 10*3/uL (ref 0.1–1.0)
MONOS PCT: 8 % (ref 3–12)
NEUTROS ABS: 7.7 10*3/uL (ref 1.7–7.7)
Neutrophils Relative %: 68 % (ref 43–77)
Platelets: 361 10*3/uL (ref 150–400)
RBC: 4.31 MIL/uL (ref 3.87–5.11)
RDW: 11.5 % (ref 11.5–15.5)
WBC: 11.5 10*3/uL — ABNORMAL HIGH (ref 4.0–10.5)

## 2013-09-24 LAB — POCT RAPID STREP A: Streptococcus, Group A Screen (Direct): NEGATIVE

## 2013-09-24 NOTE — ED Provider Notes (Signed)
CSN: [id number]     Arrival date & time 09/24/13  1546 History   First MD Initiated Contact with Patient 09/24/13 1657     Chief Complaint  Patient presents with  . Sore Throat   (Consider location/radiation/quality/duration/timing/severity/associated sxs/prior Treatment) HPI Comments: 33 year old female complaining of a five-day history of off-and-on fever, laryngitis, upper respiratory congestion, cough, runny nose, PND and sore throat. She states she had temperature 100.2 degrees this afternoon. She measured her temperature with an ear thermometer. Her temperature in the urgent care is 99. Denies known exposure to ticks or others with illness.   Past Medical History  Diagnosis Date  . GERD (gastroesophageal reflux disease)   . Pulmonary nodule, right   . Hyperlipemia    Past Surgical History  Procedure Laterality Date  . Cesarean section  11/04/09  . Mouth surgery  1995  . Rhinoplasty  2009   Family History  Problem Relation Age of Onset  . Heart disease Mother   . Heart disease Father   . Hypertension Father   . Colon cancer Paternal Grandmother   . Diabetes Paternal Grandmother   . Hyperlipidemia Father   . Heart attack Mother 35  . Heart attack Father 16  . Colon cancer Maternal Grandfather   . Colon cancer Maternal Grandmother   . Stroke Paternal Grandmother   . Asthma Brother   . Heart attack Paternal Grandfather    History  Substance Use Topics  . Smoking status: Former Smoker    Quit date: 12/23/2008  . Smokeless tobacco: Never Used  . Alcohol Use: 4.2 oz/week    7 Glasses of wine per week   OB History   Grav Para Term Preterm Abortions TAB SAB Ect Mult Living   1 1  1      1      Review of Systems  Constitutional: Positive for activity change and fatigue. Negative for fever, chills and appetite change.  HENT: Positive for congestion, postnasal drip, rhinorrhea, sinus pressure and sore throat. Negative for facial swelling.   Eyes: Negative.    Respiratory: Positive for cough. Negative for shortness of breath.   Cardiovascular: Negative.   Gastrointestinal: Negative.   Genitourinary: Negative.   Musculoskeletal: Negative for neck pain and neck stiffness.  Skin: Negative for pallor and rash.  Neurological: Negative.  Negative for tremors, seizures, syncope, speech difficulty, numbness and headaches.  Psychiatric/Behavioral: Negative.     Allergies  Fluconazole in dextrose and Latex  Home Medications   Prior to Admission medications   Medication Sig Start Date End Date Taking? Authorizing Provider  EPINEPHrine 0.3 mg/0.3 mL IJ SOAJ injection Inject 0.3 mg into the muscle as needed.    Historical Provider, MD  fish oil-omega-3 fatty acids 1000 MG capsule Take 1 g by mouth daily.    Historical Provider, MD  loratadine (CLARITIN) 10 MG tablet Take 1 tablet (10 mg total) by mouth daily. 09/20/13   Rosalita Chessman, DO  meloxicam (MOBIC) 15 MG tablet Take 1 tablet (15 mg total) by mouth daily. 09/20/13   Rosalita Chessman, DO  thiamine (VITAMIN B-1) 100 MG tablet Take 100 mg by mouth daily.    Historical Provider, MD  triamcinolone (NASACORT ALLERGY 24HR) 55 MCG/ACT AERO nasal inhaler Place 2 sprays into the nose daily. 09/20/13   Rosalita Chessman, DO   BP 115/83  Pulse 89  Temp(Src) 99 F (37.2 C) (Oral)  Resp 18  SpO2 100%  LMP 08/20/2013 Physical Exam  Nursing note and vitals  reviewed. Constitutional: She is oriented to person, place, and time. She appears well-developed and well-nourished. No distress.  HENT:  Mouth/Throat: No oropharyngeal exudate.  Bilateral TMs are normal Oropharynx with mild erythema, cobblestoning and copious clear PND.  Eyes: Conjunctivae and EOM are normal. Pupils are equal, round, and reactive to light.  Neck: Normal range of motion. Neck supple.  Cardiovascular: Normal rate, regular rhythm and normal heart sounds.   No murmur heard. Pulmonary/Chest: Effort normal and breath sounds normal. No  respiratory distress. She has no wheezes. She has no rales.  Musculoskeletal: Normal range of motion. She exhibits no edema.  Lymphadenopathy:    She has no cervical adenopathy.  Neurological: She is alert and oriented to person, place, and time.  Skin: Skin is warm. No rash noted. She is diaphoretic.  Psychiatric: She has a normal mood and affect.    ED Course  Procedures (including critical care time) Labs Review Labs Reviewed  CBC WITH DIFFERENTIAL  ROCKY MTN SPOTTED FVR AB, IGM-BLOOD  ROCKY MTN SPOTTED FVR AB, IGG-BLOOD  POCT RAPID STREP A ([facility] ONLY)    Imaging Review No results found. Results for orders placed during the hospital encounter of 09/24/13  POCT RAPID STREP A ([facility] ONLY)      Result Value Ref Range   Streptococcus, Group A Screen (Direct) NEGATIVE  NEGATIVE     MDM   1. Allergic rhinitis   2. Fever   3. Viral syndrome     Obtained CBC and will call results. All of her symptoms are typical of allergic rhinitis except the patient's recall of fever of 102. Her exam is consistent with allergic rhinitis but no other findings to suggest a bacterial infection. Rapid strep was negative, culture pending. Certainly possible that she may have a concomitant viral syndrome associated with the rhinitis symptoms. She is advised to drink plenty of fluids and take OTC medications as written on her instruction sheet to help with symptoms. She get worse or develop new symptoms or problems she should followup with her PCP or go to the emergency department pending on severity. She does not appear acutely ill or toxic. Currently stable.     Janne Napoleon, NP 09/24/13 1743

## 2013-09-24 NOTE — ED Notes (Signed)
Patient complains of sore throat,congestion,hoarse voice And low grade temperature since tuesday

## 2013-09-24 NOTE — Discharge Instructions (Signed)
Allergic Rhinitis Allegra 180 mg Sudafed PE Robitussin flonase nasal spray Nasal saline spray Lots of oral fluids Allergic rhinitis is when the mucous membranes in the nose respond to allergens. Allergens are particles in the air that cause your body to have an allergic reaction. This causes you to release allergic antibodies. Through a chain of events, these eventually cause you to release histamine into the blood stream. Although meant to protect the body, it is this release of histamine that causes your discomfort, such as frequent sneezing, congestion, and an itchy, runny nose.  CAUSES  Seasonal allergic rhinitis (hay fever) is caused by pollen allergens that may come from grasses, trees, and weeds. Year-round allergic rhinitis (perennial allergic rhinitis) is caused by allergens such as house dust mites, pet dander, and mold spores.  SYMPTOMS   Nasal stuffiness (congestion).  Itchy, runny nose with sneezing and tearing of the eyes. DIAGNOSIS  Your health care provider can help you determine the allergen or allergens that trigger your symptoms. If you and your health care provider are unable to determine the allergen, skin or blood testing may be used. TREATMENT  Allergic Rhinitis does not have a cure, but it can be controlled by:  Medicines and allergy shots (immunotherapy).  Avoiding the allergen. Hay fever may often be treated with antihistamines in pill or nasal spray forms. Antihistamines block the effects of histamine. There are over-the-counter medicines that may help with nasal congestion and swelling around the eyes. Check with your health care provider before taking or giving this medicine.  If avoiding the allergen or the medicine prescribed do not work, there are many new medicines your health care provider can prescribe. Stronger medicine may be used if initial measures are ineffective. Desensitizing injections can be used if medicine and avoidance does not work.  Desensitization is when a patient is given ongoing shots until the body becomes less sensitive to the allergen. Make sure you follow up with your health care provider if problems continue. HOME CARE INSTRUCTIONS It is not possible to completely avoid allergens, but you can reduce your symptoms by taking steps to limit your exposure to them. It helps to know exactly what you are allergic to so that you can avoid your specific triggers. SEEK MEDICAL CARE IF:   You have a fever.  You develop a cough that does not stop easily (persistent).  You have shortness of breath.  You start wheezing.  Symptoms interfere with normal daily activities. Document Released: 01/13/2001 Document Revised: 02/08/2013 Document Reviewed: 12/26/2012 [facility] Patient Information 2014 [place].  Fever, Adult A fever is a temperature of 100.4 F (38 C) or above.  HOME CARE  Take fever medicine as told by your doctor. Do not  take aspirin for fever if you are younger than 34 years of age.  If you are given antibiotic medicine, take it as told. Finish the medicine even if you start to feel better.  Rest.  Drink enough fluids to keep your pee (urine) clear or pale yellow. Do not drink alcohol.  Take a bath or shower with room temperature water. Do not use ice water or alcohol sponge baths.  Wear lightweight, loose clothes. GET HELP RIGHT AWAY IF:   You are short of breath or have trouble breathing.  You are very weak.  You are dizzy or you pass out (faint).  You are very thirsty or are making little or no urine.  You have new pain.  You throw up (vomit) or have watery  poop (diarrhea).  You keep throwing up or having watery poop for more than 1 to 2 days.  You have a stiff neck or light bothers your eyes.  You have a skin rash.  You have a fever or problems (symptoms) that last for more than 2 to 3 days.  You have a fever and your problems quickly get worse.  You keep throwing up the  fluids you drink.  You do not feel better after 3 days.  You have new problems. MAKE SURE YOU:   Understand these instructions.  Will watch your condition.  Will get help right away if you are not doing well or get worse. Document Released: 01/28/2008 Document Revised: 07/13/2011 Document Reviewed: 02/19/2011 [facility] Patient Information 2014 [place].  Viral Infections A viral infection can be caused by different types of viruses.Most viral infections are not serious and resolve on their own. However, some infections may cause severe symptoms and may lead to further complications. SYMPTOMS Viruses can frequently cause:  Minor sore throat.  Aches and pains.  Headaches.  Runny nose.  Different types of rashes.  Watery eyes.  Tiredness.  Cough.  Loss of appetite.  Gastrointestinal infections, resulting in nausea, vomiting, and diarrhea. These symptoms do not respond to antibiotics because the infection is not caused by bacteria. However, you might catch a bacterial infection following the viral infection. This is sometimes called a "superinfection." Symptoms of such a bacterial infection may include:  Worsening sore throat with pus and difficulty swallowing.  Swollen neck glands.  Chills and a high or persistent fever.  Severe headache.  Tenderness over the sinuses.  Persistent overall ill feeling (malaise), muscle aches, and tiredness (fatigue).  Persistent cough.  Yellow, green, or brown mucus production with coughing. HOME CARE INSTRUCTIONS   Only take over-the-counter or prescription medicines for pain, discomfort, diarrhea, or fever as directed by your caregiver.  Drink enough water and fluids to keep your urine clear or pale yellow. Sports drinks can provide valuable electrolytes, sugars, and hydration.  Get plenty of rest and maintain proper nutrition. Soups and broths with crackers or rice are fine. SEEK IMMEDIATE MEDICAL CARE IF:   You  have severe headaches, shortness of breath, chest pain, neck pain, or an unusual rash.  You have uncontrolled vomiting, diarrhea, or you are unable to keep down fluids.  You or your child has an oral temperature above 102 F (38.9 C), not controlled by medicine.  Your baby is older than 3 months with a rectal temperature of 102 F (38.9 C) or higher.  Your baby is 25 months old or younger with a rectal temperature of 100.4 F (38 C) or higher. MAKE SURE YOU:   Understand these instructions.  Will watch your condition.  Will get help right away if you are not doing well or get worse. Document Released: 01/28/2005 Document Revised: 07/13/2011 Document Reviewed: 08/25/2010 [facility] Patient Information 2014 [place], [place].

## 2013-09-24 NOTE — ED Provider Notes (Signed)
Medical screening examination/treatment/procedure(s) were performed by non-physician practitioner and as supervising physician I was immediately available for consultation/collaboration.  Naijah Lacek, M.D.  Faraz Ponciano C Eliezer Khawaja, MD 09/24/13 2220 

## 2013-09-26 ENCOUNTER — Other Ambulatory Visit: Payer: Self-pay | Admitting: Gynecology

## 2013-09-26 DIAGNOSIS — N926 Irregular menstruation, unspecified: Secondary | ICD-10-CM

## 2013-09-26 LAB — CULTURE, GROUP A STREP

## 2013-09-26 LAB — ROCKY MTN SPOTTED FVR AB, IGG-BLOOD: RMSF IGG: 0.1 IV

## 2013-09-26 LAB — ROCKY MTN SPOTTED FVR AB, IGM-BLOOD: RMSF IgM: 0.58 IV (ref 0.00–0.89)

## 2013-09-28 ENCOUNTER — Ambulatory Visit: Payer: BC Managed Care – PPO | Admitting: Family Medicine

## 2013-09-29 ENCOUNTER — Ambulatory Visit: Payer: BC Managed Care – PPO | Admitting: Gynecology

## 2013-09-29 ENCOUNTER — Encounter: Payer: Self-pay | Admitting: Internal Medicine

## 2013-09-29 ENCOUNTER — Other Ambulatory Visit: Payer: BC Managed Care – PPO

## 2013-09-29 ENCOUNTER — Ambulatory Visit (INDEPENDENT_AMBULATORY_CARE_PROVIDER_SITE_OTHER): Payer: BC Managed Care – PPO | Admitting: Internal Medicine

## 2013-09-29 ENCOUNTER — Telehealth: Payer: Self-pay | Admitting: Family Medicine

## 2013-09-29 VITALS — BP 110/70 | HR 80 | Temp 98.2°F | Resp 16 | Ht 63.0 in | Wt 131.5 lb

## 2013-09-29 DIAGNOSIS — K120 Recurrent oral aphthae: Secondary | ICD-10-CM

## 2013-09-29 DIAGNOSIS — J069 Acute upper respiratory infection, unspecified: Secondary | ICD-10-CM

## 2013-09-29 MED ORDER — HYDROCODONE-HOMATROPINE 5-1.5 MG/5ML PO SYRP: 5.0000 mL | ORAL_SOLUTION | Freq: Three times a day (TID) | ORAL | Status: DC | PRN

## 2013-09-29 MED ORDER — FIRST-DUKES MOUTHWASH MT SUSP: 5.0000 mL | Freq: Four times a day (QID) | OROMUCOSAL | Status: DC | PRN

## 2013-09-29 NOTE — Patient Instructions (Signed)
Oral Ulcers °Oral ulcers are painful, shallow sores around the lining of the mouth. They can affect the gums, the inside of the lips and the cheeks (sores on the outside of the lips and on the face are different). They typically first occur in school aged children and teenagers. Oral ulcers may also be called canker sores or cold sores. °CAUSES  °Canker sores and cold sores can be caused by many factors including: °· Infection. °· Injury. °· Sun exposure. °· Medications. °· Emotional stress. °· Food allergies. °· Vitamin deficiencies. °· Toothpastes containing sodium lauryl sulfate. °The Herpes Virus can be the cause of mouth ulcers. The first infection can be severe and cause 10 or more ulcers on the gums, tongue and lips with fever and difficulty in swallowing. This infection usually occurs between the ages of 1 and 3 years.  °SYMPTOMS  °The typical sore is about ¼ inch (6 mm) in size, is an oval or round ulcer with red borders. °DIAGNOSIS  °Your caregiver can diagnose simple oral ulcers by examination. Additional testing is usually not required.  °TREATMENT  °Treatment is aimed at pain relief. Generally, oral ulcers resolve by themselves within 1 to 2 weeks without medication and are not contagious unless caused by Herpes (and other viruses). Antibiotics are not effective with mouth sores. Avoid direct contact with others until the ulcer is completely healed. See your caregiver for follow-up care as recommended. Also: °· Offer a soft diet. °· Encourage plenty of fluids to prevent dehydration. Popsicles and milk shakes can be helpful. °· Avoid acidic and salty foods and drinks such as orange juice. °· Infants and young children will often refuse to drink because of pain. Using a teaspoon, cup or syringe to give small amounts of fluids frequently can help prevent dehydration. °· Cold compresses on the face may help reduce pain. °· Pain medication can help control soreness. °· A solution of diphenhydramine mixed  with a liquid antacid can be useful to decrease the soreness of ulcers. Consult a caregiver for the dosing. °· Liquids or ointments with a numbing ingredient may be helpful when used as recommended. °· Older children and teenagers can rinse their mouth with a salt-water mixture (1/2 teaspoonof salt in 8 ounces of water) four times a day. This treatment is uncomfortable but may reduce the time the ulcers are present. °· There are many over the counter throat lozenges and medications available for oral ulcers. There effectiveness has not been studied. °· Consult your medical caregiver prior to using homeopathic treatments for oral ulcers. °SEEK MEDICAL CARE IF:  °· You think your child needs to be seen. °· The pain worsens and you cannot control it. °· There are 4 or more ulcers. °· The lips and gums begin to bleed and crust. °· A single mouth ulcer is near a tooth that is causing a toothache or pain. °· Your child has a fever, swollen face, or swollen glands. °· The ulcers began after starting a medication. °· Mouth ulcers keep re-occurring or last more than 2 weeks. °· You think your child is not taking adequate fluids. °SEEK IMMEDIATE MEDICAL CARE IF:  °· Your child has a high fever. °· Your child is unable to swallow or becomes dehydrated. °· Your child looks or acts very ill. °· An ulcer caused by a chemical your child accidentally put in their mouth. °Document Released: 05/28/2004 Document Revised: 07/13/2011 Document Reviewed: 01/10/2009 °[employer]® Patient Information ©2014 [employer]. ° °

## 2013-09-29 NOTE — Progress Notes (Signed)
Subjective:    Patient ID: Carrie Morrison, female    DOB: Sep 03, 1980, 33 y.o.   MRN: [id number]  Cough This is a recurrent problem. The current episode started in the past 7 days. The problem has been rapidly improving. The problem occurs every few hours. The cough is non-productive. Associated symptoms include a sore throat. Pertinent negatives include no chest pain, chills, ear congestion, ear pain, fever, headaches, heartburn, hemoptysis, myalgias, nasal congestion, postnasal drip, rash, rhinorrhea, shortness of breath, sweats, weight loss or wheezing. Nothing aggravates the symptoms. She has tried OTC cough suppressant for the symptoms. The treatment provided mild relief. There is no history of asthma, bronchiectasis, bronchitis, COPD, emphysema, environmental allergies or pneumonia.      Review of Systems  Constitutional: Negative.  Negative for fever, chills and weight loss.  HENT: Positive for mouth sores and sore throat. Negative for congestion, ear pain, facial swelling, postnasal drip, rhinorrhea, tinnitus, trouble swallowing and voice change.   Eyes: Negative.   Respiratory: Positive for cough. Negative for hemoptysis, shortness of breath and wheezing.   Cardiovascular: Negative.  Negative for chest pain, palpitations and leg swelling.  Gastrointestinal: Negative.  Negative for heartburn, abdominal pain and diarrhea.  Endocrine: Negative.   Genitourinary: Negative.   Musculoskeletal: Negative.  Negative for arthralgias, back pain, myalgias and neck pain.  Skin: Negative.  Negative for rash.  Allergic/Immunologic: Negative.  Negative for environmental allergies.  Neurological: Negative.  Negative for headaches.  Hematological: Negative.  Negative for adenopathy. Does not bruise/bleed easily.  Psychiatric/Behavioral: Negative.        Objective:   Physical Exam  Vitals reviewed. Constitutional: She is oriented to person, place, and time. She appears well-developed and  well-nourished.  Non-toxic appearance. She does not have a sickly appearance. She does not appear ill. No distress.  HENT:  Head: Normocephalic and atraumatic.  Mouth/Throat: Oropharynx is clear and moist and mucous membranes are normal. Mucous membranes are not pale, not dry and not cyanotic. Oral lesions present. No trismus in the jaw. No uvula swelling. No oropharyngeal exudate, posterior oropharyngeal edema, posterior oropharyngeal erythema or tonsillar abscesses.  There are erythematous ulcers on her soft palate  Eyes: Conjunctivae are normal. Right eye exhibits no discharge. Left eye exhibits no discharge. No scleral icterus.  Neck: Normal range of motion. Neck supple. No JVD present. No tracheal deviation present. No thyromegaly present.  Cardiovascular: Normal rate, regular rhythm, normal heart sounds and intact distal pulses.  Exam reveals no gallop and no friction rub.   No murmur heard. Pulmonary/Chest: Effort normal and breath sounds normal. No stridor. No respiratory distress. She has no wheezes. She has no rales. She exhibits no tenderness.  Abdominal: Soft. Bowel sounds are normal. She exhibits no distension and no mass. There is no tenderness. There is no rebound and no guarding.  Musculoskeletal: Normal range of motion. She exhibits no edema and no tenderness.  Lymphadenopathy:    She has no cervical adenopathy.  Neurological: She is oriented to person, place, and time.  Skin: Skin is warm and dry. No rash noted. She is not diaphoretic. No erythema. No pallor.  Psychiatric: She has a normal mood and affect. Her behavior is normal. Judgment and thought content normal.     Lab Results  Component Value Date   WBC 11.5* 09/24/2013   HGB 14.3 09/24/2013   HCT 41.7 09/24/2013   PLT 361 09/24/2013   GLUCOSE 93 09/05/2013   CHOL 206* 09/05/2013   TRIG 46 09/05/2013  HDL 91 09/05/2013   LDLCALC 106* 09/05/2013   ALT 12 09/05/2013   AST 16 09/05/2013   NA 136 09/05/2013   K 4.1 09/05/2013   CL  101 09/05/2013   CREATININE 0.77 09/05/2013   BUN 12 09/05/2013   CO2 26 09/05/2013   TSH 1.474 09/05/2013   HGBA1C 5.1 10/29/2011       Assessment & Plan:

## 2013-09-29 NOTE — Telephone Encounter (Signed)
Patient Information:  Caller Name: Carrie Morrison  Phone: [phone]  Patient: Carrie Morrison, Carrie Morrison  Gender: Female  DOB: October 07, 1980  Age: 33 Years  PCP: Rosalita Chessman.  Pregnant: No  Office Follow Up:  Does the office need to follow up with this patient?: No  Instructions For The Office: N/A  RN Note:  Patient calling about mouth sores on roof of mouth.  States has had fever since 09/20/13 which has resolved.  Seen in UC 09/24/13 and strep testing was negative.  Afebrile.  States these spots are like lumps, but sort of like ulcers, as well. Per skin lesions protocol, emergent symptoms denied; advised appt within 24 hours.  Appt scheduled 09/29/13 1545 with Dr. Ronnald Ramp at [facility] office.  krs/can  Symptoms  Reason For Call & Symptoms: mouth sores  Reviewed Health History In EMR: Yes  Reviewed Medications In EMR: Yes  Reviewed Allergies In EMR: Yes  Reviewed Surgeries / Procedures: Yes  Date of Onset of Symptoms: 09/19/2013 OB / GYN:  LMP: Unknown  Guideline(s) Used:  Skin Lesion - Moles or Growths  Disposition Per Guideline:   See Today or Tomorrow in Office  Reason For Disposition Reached:   Caller cannot describe it clearly  Advice Given:  N/A  Patient Will Follow Care Advice:  YES  Appointment Scheduled:  09/29/2013 15:45:00 Appointment Scheduled Provider:  N/A

## 2013-09-29 NOTE — Progress Notes (Signed)
Pre visit review using our clinic review tool, if applicable. No additional management support is needed unless otherwise documented below in the visit note. 

## 2013-10-01 ENCOUNTER — Encounter: Payer: Self-pay | Admitting: Internal Medicine

## 2013-10-01 NOTE — Assessment & Plan Note (Signed)
These are related to the viral URI Will treat with MMW

## 2013-10-01 NOTE — Assessment & Plan Note (Signed)
She has a viral URI with cough, she tells me that this is improving I think she should try a cough suppressant

## 2013-10-10 ENCOUNTER — Ambulatory Visit (INDEPENDENT_AMBULATORY_CARE_PROVIDER_SITE_OTHER): Payer: BC Managed Care – PPO | Admitting: Family Medicine

## 2013-10-10 ENCOUNTER — Encounter: Payer: Self-pay | Admitting: Family Medicine

## 2013-10-10 ENCOUNTER — Other Ambulatory Visit (INDEPENDENT_AMBULATORY_CARE_PROVIDER_SITE_OTHER): Payer: BC Managed Care – PPO

## 2013-10-10 VITALS — BP 112/72 | HR 95 | Ht 62.0 in | Wt 134.0 lb

## 2013-10-10 DIAGNOSIS — R269 Unspecified abnormalities of gait and mobility: Secondary | ICD-10-CM

## 2013-10-10 DIAGNOSIS — M79609 Pain in unspecified limb: Secondary | ICD-10-CM

## 2013-10-10 DIAGNOSIS — M79605 Pain in left leg: Secondary | ICD-10-CM

## 2013-10-10 NOTE — Assessment & Plan Note (Signed)
Patient's pain in her limp seems to be more associated with a muscle injury. Patient was given a compression sleeve, icing, and anti-inflammatories we've discussed. Patient is to try this and we also washed patient run. Patient gait abnormality could also be causing some difficulty. We discussed changing her gait that could be beneficial. Patient's will try these interventions and come back and see me again in 3-4 weeks for further evaluation and treatment the

## 2013-10-10 NOTE — Patient Instructions (Signed)
Good to see you and I am glad you are doing better Compression with activity and 30 minutes afterward Ice 20 minutes after running Work on stride length of left leg.  Look at handout of exercises to do 3 times a week.  Consider vitamin D 2000 IU daily Turmeric 500mg  twice daily.  Come back again in 4 weeks if not perfect.

## 2013-10-10 NOTE — Progress Notes (Signed)
[name] [place], [place] [address] Phone: [phone] Subjective:      CC: Left leg pain and swelling  [id number] Carrie Morrison is a 33 y.o. female coming in with complaint of left leg pain and swelling. Patient was seen previously and did have workup including advanced imaging which was negative for any type of venous thrombosis. Patient also had x-rays of the femur that did not show any bony abnormality. Patient's ultrasound there was a questionable for a soft tissue mass. Patient is to come back one week after first initial visit which was greater than 2 weeks ago. Patient states overall she has actually made some mild improvement. Patient has been able to run without any significant discomfort. Patient still has a dull aching pain mostly over the lateral aspect of the thigh. Denies any radiation down the legs any numbness or tingling. Denies any fevers or chills or any abnormal weight loss.     Past medical history, social, surgical and family history all reviewed in electronic medical record.   Review of Systems: No headache, visual changes, nausea, vomiting, diarrhea, constipation, dizziness, abdominal pain, skin rash, fevers, chills, night sweats, weight loss, swollen lymph nodes, body aches, joint swelling, muscle aches, chest pain, shortness of breath, mood changes.   Objective Blood pressure 112/72, pulse 95, height 5\' 2"  (1.575 m), weight 134 lb (60.782 kg), last menstrual period 09/18/2013, SpO2 95.00%.  General: No apparent distress alert and oriented x3 mood and affect normal, dressed appropriately patient does appear ill.  HEENT: Pupils equal, extraocular movements intact  Respiratory: Patient's speak in full sentences and does not appear short of breath  Cardiovascular: No lower extremity edema, non tender, no erythema  Skin: Warm dry intact with no signs of infection or rash on extremities or on axial skeleton.  Abdomen:  Soft nontender  Neuro: Cranial nerves II through XII are intact, neurovascularly intact in all extremities with 2+ DTRs and 2+ pulses.  Lymph: No lymphadenopathy of posterior or anterior cervical chain or axillae bilaterally.  Gait normal with good balance and coordination.  MSK:  Non tender with full range of motion and good stability and symmetric strength and tone of shoulders, elbows, wrist and ankles bilaterally.   Hip: Left ROM IR: 45 Deg, ER: 45 Deg, Flexion: 120 Deg, Extension: 100 Deg, Abduction: 45 Deg, Adduction: 45 Deg Strength IR: 5/5, ER: 5/5, Flexion: 5/5, Extension: 5/5, Abduction: 4/5, Adduction: 5/5 Pelvic alignment unremarkable to inspection and palpation. Standing hip rotation and gait without trendelenburg sign / unsteadiness. Greater trochanter with minimal tenderness to palpation. No tenderness over piriformis  No pain with FABER or FADIR. No SI joint tenderness and normal minimal SI movement. Patient has a negative fulcrum test.  Patient is still minimally tender over the vastus lateralis muscle about 9 cm from the knee proximally.  Patient's knee has full range of motion and is nontender. Neurovascularly intact distally with 2+ DTRs. Contralateral hip is unremarkable  Limited muscular skeletal ultrasound was performed Lyndal Pulley Repeat ultrasound the patient's vastus lateralis muscle shows that there is significant decrease hypoechoic changes and previously seen. Patient was likely having more of a myositis previously. Patient does have scarring within the muscle but likely is from a tear previously as well.  Impression: Healed vastus lateralis bursitis and possible partial tear.  Gait analysis shows the patient does have a shorter stride on the left causing patient have more of a midfoot strike Impression and Recommendations:  This case required medical decision making of moderate complexity.

## 2014-03-05 ENCOUNTER — Encounter: Payer: Self-pay | Admitting: Family Medicine

## 2014-04-24 ENCOUNTER — Ambulatory Visit (INDEPENDENT_AMBULATORY_CARE_PROVIDER_SITE_OTHER): Payer: BC Managed Care – PPO | Admitting: Family Medicine

## 2014-04-24 ENCOUNTER — Encounter: Payer: Self-pay | Admitting: Family Medicine

## 2014-04-24 VITALS — BP 104/66 | HR 64 | Temp 97.9°F | Wt 135.6 lb

## 2014-04-24 DIAGNOSIS — M25571 Pain in right ankle and joints of right foot: Secondary | ICD-10-CM

## 2014-04-24 NOTE — Progress Notes (Signed)
   Subjective:    Patient ID: Carrie Morrison, female    DOB: 10-03-1980, 33 y.o.   MRN: [id number]  HPI Pt here c/o "lump" on the lower part on R leg and pain in ankle x several months.  No known injury but she did run her first 1/2 marathon--in October.   She believes it bothered her before then.     Review of Systems  Constitutional: Positive for activity change.  Musculoskeletal: Positive for joint swelling. Negative for myalgias, arthralgias and gait problem.  Neurological: Negative for weakness.      Objective:   Physical Exam  BP 104/66 mmHg  Pulse 64  Temp(Src) 97.9 F (36.6 C) (Oral)  Wt 135 lb 9.6 oz (61.508 kg)  SpO2 98% General appearance: alert, cooperative, appears stated age and no distress Extremities: extremities normal, atraumatic, no cyanosis or edema      Assessment & Plan:  1. Pain in joint, ankle and foot, right Rest, ice, elevated Ankle sleeve prn  - Ambulatory referral to Sports Medicine

## 2014-04-24 NOTE — Patient Instructions (Signed)

## 2014-04-24 NOTE — Progress Notes (Signed)
Pre visit review using our clinic review tool, if applicable. No additional management support is needed unless otherwise documented below in the visit note. 

## 2014-05-09 ENCOUNTER — Ambulatory Visit: Payer: BLUE CROSS/BLUE SHIELD | Admitting: Family Medicine

## 2014-05-14 ENCOUNTER — Encounter: Payer: Self-pay | Admitting: Family Medicine

## 2014-05-14 ENCOUNTER — Ambulatory Visit (INDEPENDENT_AMBULATORY_CARE_PROVIDER_SITE_OTHER): Payer: BLUE CROSS/BLUE SHIELD | Admitting: Family Medicine

## 2014-05-14 VITALS — BP 125/74 | HR 78 | Ht 62.0 in | Wt 135.0 lb

## 2014-05-14 DIAGNOSIS — M25471 Effusion, right ankle: Secondary | ICD-10-CM

## 2014-05-14 NOTE — Patient Instructions (Signed)
The area in question corresponds to the peroneal muscle group on the outside of the ankle. I would just reassure you regarding this. If it becomes painful you can consider arch support, external rotation exercises of the ankle, and anti-inflammatories. Activities as tolerated - no restrictions. Follow up as needed.

## 2014-05-16 ENCOUNTER — Encounter: Payer: Self-pay | Admitting: Family Medicine

## 2014-05-16 NOTE — Assessment & Plan Note (Signed)
consistent with the peroneal muscle group just more defined on this side.  Ultrasound does not show a lipoma, other mass, cyst, or evidence of muscle tear.  Reassured patient.  F/u prn.

## 2014-05-16 NOTE — Progress Notes (Signed)
PCP and referred by: Garnet Koyanagi, DO  Subjective:   HPI: Patient is a 34 y.o. female here for right ankle pain.  Patient reports she runs about 25 miles a week divided over 3-4 days. Started to notice a swollen area lateral right ankle that comes up especially when running. Actually reports no pain here currently and doesn't worsen with running. No known trauma. No other complaints.  Past Medical History  Diagnosis Date  . GERD (gastroesophageal reflux disease)   . Pulmonary nodule, right   . Hyperlipemia     Current Outpatient Prescriptions on File Prior to Visit  Medication Sig Dispense Refill  . EPINEPHrine 0.3 mg/0.3 mL IJ SOAJ injection Inject 0.3 mg into the muscle as needed.     No current facility-administered medications on file prior to visit.    Past Surgical History  Procedure Laterality Date  . Cesarean section  11/04/09  . Mouth surgery  1995  . Rhinoplasty  2009    Allergies  Allergen Reactions  . Fluconazole In Dextrose Other (See Comments)    Tongue /throat swelled up  . Latex Anaphylaxis, Hives, Shortness Of Breath and Itching    Tongue and throat swelling    History   Social History  . Marital Status: Married    Spouse Name: N/A    Number of Children: N/A  . Years of Education: N/A   Occupational History  . Not on file.   Social History Main Topics  . Smoking status: Former Smoker    Quit date: 12/23/2008  . Smokeless tobacco: Never Used  . Alcohol Use: 4.2 oz/week    7 Glasses of wine per week  . Drug Use: No  . Sexual Activity:    Partners: Male    Birth Control/ Protection: None   Other Topics Concern  . Not on file   Social History Narrative    Family History  Problem Relation Age of Onset  . Heart disease Mother   . Heart disease Father   . Hypertension Father   . Colon cancer Paternal Grandmother   . Diabetes Paternal Grandmother   . Hyperlipidemia Father   . Heart attack Mother 15  . Heart attack Father 69  .  Colon cancer Maternal Grandfather   . Colon cancer Maternal Grandmother   . Stroke Paternal Grandmother   . Asthma Brother   . Heart attack Paternal Grandfather     BP 125/74 mmHg  Pulse 78  Ht 5\' 2"  (1.575 m)  Wt 135 lb (61.236 kg)  BMI 24.69 kg/m2  Review of Systems: See HPI above.    Objective:  Physical Exam:  Gen: NAD  Right ankle: Slightly more definition right peroneals distally compared to left.  No palpable mass otherwise.  No other deformity. FROM ankle without pain.  5/5 strength. No TTP Negative ant drawer and talar tilt.   Negative syndesmotic compression. Thompsons test negative. NV intact distally.    MSK u/s:  Area in question of right ankle by ultrasound appears to be muscular, consistent with peroneal muscle.  No evidence of lipoma, cyst, muscle tear/rupture.  Assessment & Plan:  1. Right ankle swelling - consistent with the peroneal muscle group just more defined on this side.  Ultrasound does not show a lipoma, other mass, cyst, or evidence of muscle tear.  Reassured patient.  F/u prn.

## 2014-07-17 ENCOUNTER — Ambulatory Visit (INDEPENDENT_AMBULATORY_CARE_PROVIDER_SITE_OTHER): Payer: BLUE CROSS/BLUE SHIELD | Admitting: Physician Assistant

## 2014-07-17 ENCOUNTER — Encounter: Payer: Self-pay | Admitting: Physician Assistant

## 2014-07-17 VITALS — BP 111/74 | HR 72 | Temp 98.4°F | Resp 16 | Ht 62.0 in | Wt 133.1 lb

## 2014-07-17 DIAGNOSIS — R2241 Localized swelling, mass and lump, right lower limb: Secondary | ICD-10-CM

## 2014-07-17 NOTE — Progress Notes (Signed)
Pre visit review using our clinic review tool, if applicable. No additional management support is needed unless otherwise documented below in the visit note/SLS  

## 2014-07-17 NOTE — Patient Instructions (Signed)
Please take some Aleve or Ibuprofen before the race.  Also put some topical Biofreeze on the area in question to help with any soreness from running.  I do want you to see Dr. Tamala Julian for further evaluation.  I think this bares further workup.

## 2014-07-17 NOTE — Progress Notes (Signed)
Patient presents to clinic today for second opinion regarding a mass of her RLE just superior to medial malleolus. Has had previous evaluation by Sports Medicine with Ultrasonography.  Patient was told that only muscular hypertrophy of peroneal group noted.  No evidence of lipoma.   Patient states she feels further workup is needed.  Has upcoming appointment with another specialist for further evaluation and imaging.  Is wanting to know if she can participate in a race this weekend.  Patient states mass is tender to touch.  Denies warmth, redness or drainage of lesion.  States it feel soft.  Has not taken anything for symptoms.  Past Medical History  Diagnosis Date  . GERD (gastroesophageal reflux disease)   . Pulmonary nodule, right   . Hyperlipemia     Current Outpatient Prescriptions on File Prior to Visit  Medication Sig Dispense Refill  . EPINEPHrine 0.3 mg/0.3 mL IJ SOAJ injection Inject 0.3 mg into the muscle as needed.     No current facility-administered medications on file prior to visit.    Allergies  Allergen Reactions  . Fluconazole In Dextrose Other (See Comments)    Tongue /throat swelled up  . Latex Anaphylaxis, Hives, Shortness Of Breath and Itching    Tongue and throat swelling    Family History  Problem Relation Age of Onset  . Heart disease Mother   . Heart disease Father   . Hypertension Father   . Colon cancer Paternal Grandmother   . Diabetes Paternal Grandmother   . Hyperlipidemia Father   . Heart attack Mother 31  . Heart attack Father 58  . Colon cancer Maternal Grandfather   . Colon cancer Maternal Grandmother   . Stroke Paternal Grandmother   . Asthma Brother   . Heart attack Paternal Grandfather     History   Social History  . Marital Status: Married    Spouse Name: N/A  . Number of Children: N/A  . Years of Education: N/A   Social History Main Topics  . Smoking status: Former Smoker    Quit date: 12/23/2008  . Smokeless tobacco:  Never Used  . Alcohol Use: 4.2 oz/week    7 Glasses of wine per week  . Drug Use: No  . Sexual Activity:    Partners: Male    Birth Control/ Protection: None   Other Topics Concern  . None   Social History Narrative   Review of Systems - See HPI.  All other ROS are negative.  BP 111/74 mmHg  Pulse 72  Temp(Src) 98.4 F (36.9 C) (Oral)  Resp 16  Ht 5\' 2"  (1.575 m)  Wt 133 lb 2 oz (60.385 kg)  BMI 24.34 kg/m2  SpO2 99%  LMP 07/13/2014  Physical Exam  Constitutional: She is oriented to person, place, and time and well-developed, well-nourished, and in no distress.  HENT:  Head: Normocephalic and atraumatic.  Cardiovascular: Normal rate, regular rhythm, normal heart sounds and intact distal pulses.   Pulmonary/Chest: Effort normal and breath sounds normal. No respiratory distress. She has no wheezes. She has no rales. She exhibits no tenderness.  Musculoskeletal:       Legs: Neurological: She is alert and oriented to person, place, and time.  Skin: Skin is warm and dry. No rash noted.  Psychiatric: Affect normal.  Vitals reviewed.  Assessment/Plan: Mass of right lower leg Unclear etiology. Unfortunately US performed was a bedside so I only have report given by performing MD who states mass is hypertrophy  of peroneal muscle group.  Mass feels like a lipoma on examination.  Pain could be from compression on surrounding structures and nerves by the mass.  Discussed pain management and ankle stabilization.  Think it is safe for patient to participate in her event.  Encouraged her to follow-up with Dr. Tamala Julian next week as scheduled.

## 2014-07-17 NOTE — Assessment & Plan Note (Signed)
Unclear etiology. Unfortunately US performed was a bedside so I only have report given by performing MD who states mass is hypertrophy of peroneal muscle group.  Mass feels like a lipoma on examination.  Pain could be from compression on surrounding structures and nerves by the mass.  Discussed pain management and ankle stabilization.  Think it is safe for patient to participate in her event.  Encouraged her to follow-up with Dr. Tamala Julian next week as scheduled.

## 2014-07-18 ENCOUNTER — Telehealth: Payer: Self-pay | Admitting: Family Medicine

## 2014-07-18 NOTE — Telephone Encounter (Signed)
Per provider, Ok to run; but if any personal doubt, wait until she sees Sports Medicine provider. Patient informed, understood & agreed/SLS

## 2014-07-18 NOTE — Telephone Encounter (Signed)
Caller name: Celeste Relation to pt: self Call back number:  [phone] Pharmacy:  Reason for call:   She saw [facility] yesterday regarding leg pain. Patient state that while out running today she noticed that her leg was tingling.  Ronald says that she is supposed to be running in a race this weekend and wants to know if she should run or not?

## 2014-07-26 ENCOUNTER — Encounter: Payer: Self-pay | Admitting: Family Medicine

## 2014-07-26 ENCOUNTER — Ambulatory Visit (INDEPENDENT_AMBULATORY_CARE_PROVIDER_SITE_OTHER): Payer: BLUE CROSS/BLUE SHIELD | Admitting: Family Medicine

## 2014-07-26 ENCOUNTER — Other Ambulatory Visit (INDEPENDENT_AMBULATORY_CARE_PROVIDER_SITE_OTHER): Payer: BLUE CROSS/BLUE SHIELD

## 2014-07-26 VITALS — BP 108/64 | HR 63 | Ht 62.0 in | Wt 134.0 lb

## 2014-07-26 DIAGNOSIS — M79604 Pain in right leg: Secondary | ICD-10-CM

## 2014-07-26 DIAGNOSIS — M84369A Stress fracture, unspecified tibia and fibula, initial encounter for fracture: Secondary | ICD-10-CM | POA: Insufficient documentation

## 2014-07-26 NOTE — Progress Notes (Signed)
[facility] [address] [place], [place] [address] Phone: [phone] Subjective:      CC: Left leg pain and swelling  [id number] Carrie Morrison is a 34 y.o. female coming in with complaint of left leg pain and swelling.patient has ran to half marathons and fairly quick succession. Pain seemed to be worse after running. Patient has seen another provider for this and was told that he did have a increase in size of the peroneal muscle. Patient states that this was giving her significant more pain when she was turning to increase activity patient did not think that this is correct. Patient denies any numbness or tingling but states that feels that she has less control of her ankle with certain movements. Denies any fevers, chills, or any abnormal weight loss. Denies any nighttime awakening. States that it is more of a discomfort on the lateral aspect of the ankle that seems to be becoming more specific to the area in question of swelling. Rates the severity of 7 out of 10.     Past medical history, social, surgical and family history all reviewed in electronic medical record.   Review of Systems: No headache, visual changes, nausea, vomiting, diarrhea, constipation, dizziness, abdominal pain, skin rash, fevers, chills, night sweats, weight loss, swollen lymph nodes, body aches, joint swelling, muscle aches, chest pain, shortness of breath, mood changes.   Objective Blood pressure 108/64, pulse 63, height 5\' 2"  (1.575 m), weight 134 lb (60.782 kg), last menstrual period 07/13/2014, SpO2 99 %.  General: No apparent distress alert and oriented x3 mood and affect normal, dressed appropriately patient does appear ill.  HEENT: Pupils equal, extraocular movements intact  Respiratory: Patient's speak in full sentences and does not appear short of breath  Cardiovascular: No lower extremity edema, non tender, no erythema  Skin: Warm dry intact with no signs of infection  or rash on extremities or on axial skeleton.  Abdomen: Soft nontender  Neuro: Cranial nerves II through XII are intact, neurovascularly intact in all extremities with 2+ DTRs and 2+ pulses.  Lymph: No lymphadenopathy of posterior or anterior cervical chain or axillae bilaterally.  Gait normal with good balance and coordination.  MSK:  Non tender with full range of motion and good stability and symmetric strength and tone of shoulders, elbows, wrist and kneebilaterally.   Ankle:right She does have some mild enlargement wear the calf and the peroneal tendons me on the right side compared to the contralateral side when standing. Range of motion is full in all directions. Strength is 5/5 in all directions. Stable lateral and medial ligaments; squeeze test and kleiger test unremarkable; Talar dome nontender; No pain at base of 5th MT; No tenderness over cuboid; No tenderness over N spot or navicular prominence No tenderness on posterior aspects of lateral and medial malleolus Mild tenderness over the peroneal tendon but no subluxation noted. Patient is tender to palpation over the lateral fibula. Negative tarsal tunnel tinel's Able to walk 4 steps. Contralateral ankle unremarkable  Limited muscular skeletal ultrasound was performed Hulan Saas, M Limited ultrasound the patient's ankle shows the patient does have increasing Doppler flow as well as mild hypoechoic changes on the distal fibula. No cortical defect noted. This is a proximal main 4 cm from the distal aspect of the fibula.  Impression: stress reaction of the lateral fibula approximate 4 cm from distal aspect.   Impression and Recommendations:     This case required medical decision making of moderate  complexity.

## 2014-07-26 NOTE — Patient Instructions (Signed)
Good to see you.   Ice 20 minutes 2 times daily. Usually after activity and before bed. Vitamin D 2000 IU daily Limit running to 2 times a week. Or decrease mileage to 50% and increase 10% a week.  Biking and swimming is ok and elliptical you can do Exercises 3 times a week.  See me again in 3 weeks.

## 2014-07-26 NOTE — Assessment & Plan Note (Signed)
She does have more of a stress reaction of the fibula. I think that this is giving her the discomfort. Upper flow seen on ultrasound corresponding with the area of pain. Patient will limit her running but we will avoid patient in a [place] for now. We discussed icing regimen, home exercises, vitamin D supplementation, as well as compression sleeve. Patient will cross train and an come back again in 3 weeks. We will ultrasound at that time to make sure patient is healing appropriately. Patient has any worsening symptoms advance imaging would be necessary.

## 2014-07-26 NOTE — Progress Notes (Signed)
Pre visit review using our clinic review tool, if applicable. No additional management support is needed unless otherwise documented below in the visit note. 

## 2014-08-16 ENCOUNTER — Encounter: Payer: Self-pay | Admitting: Family Medicine

## 2014-08-16 ENCOUNTER — Other Ambulatory Visit (INDEPENDENT_AMBULATORY_CARE_PROVIDER_SITE_OTHER): Payer: BLUE CROSS/BLUE SHIELD

## 2014-08-16 ENCOUNTER — Ambulatory Visit (INDEPENDENT_AMBULATORY_CARE_PROVIDER_SITE_OTHER): Payer: BLUE CROSS/BLUE SHIELD | Admitting: Family Medicine

## 2014-08-16 VITALS — BP 102/70 | HR 81 | Ht 62.0 in | Wt 138.0 lb

## 2014-08-16 DIAGNOSIS — M79604 Pain in right leg: Secondary | ICD-10-CM

## 2014-08-16 DIAGNOSIS — M84369D Stress fracture, unspecified tibia and fibula, subsequent encounter for fracture with routine healing: Secondary | ICD-10-CM

## 2014-08-16 NOTE — Assessment & Plan Note (Signed)
Asian is healing fairly well at this time. We discussed that patient can slowly start increasing her activities over the course of the next several weeks. We discussed the patient's next races in July and we do have time. Patient continue with the vitamin D supplementation. Patient does continue with the compression sleeve. Patient come back and see me again in 3 weeks for further evaluation and we'll ultrasound the area likely again.

## 2014-08-16 NOTE — Progress Notes (Signed)
Pre visit review using our clinic review tool, if applicable. No additional management support is needed unless otherwise documented below in the visit note. 

## 2014-08-16 NOTE — Progress Notes (Signed)
  [name] [place], [place] [address] Phone: [phone] Subjective:      CC: Left leg pain and swelling  [id number] Carrie Morrison is a 34 y.o. female coming in with complaint of left leg pain.  Patient was doing increasing running and unfortunately did have what seemed to be a stress fracture of the fibula. Patient was given a brace, home exercises, icing protocol and was to avoid running for short course of time. Patient states she has not been running and does feel that it is better. Patient states that it is 75% better but she is not stressing it much. Patient does not have any pain with her daily activities. In some mild discomfort if she does more activity such as biking long distances but this is very minimal compared to what it was previously. Patient denies any swelling numbness or redness.     Past medical history, social, surgical and family history all reviewed in electronic medical record.   Review of Systems: No headache, visual changes, nausea, vomiting, diarrhea, constipation, dizziness, abdominal pain, skin rash, fevers, chills, night sweats, weight loss, swollen lymph nodes, body aches, joint swelling, muscle aches, chest pain, shortness of breath, mood changes.   Objective Blood pressure 102/70, pulse 81, height 5\' 2"  (1.575 m), weight 138 lb (62.596 kg), last menstrual period 07/13/2014, SpO2 98 %.  General: No apparent distress alert and oriented x3 mood and affect normal, dressed appropriately patient does appear ill.  HEENT: Pupils equal, extraocular movements intact  Respiratory: Patient's speak in full sentences and does not appear short of breath  Cardiovascular: No lower extremity edema, non tender, no erythema  Skin: Warm dry intact with no signs of infection or rash on extremities or on axial skeleton.  Abdomen: Soft nontender  Neuro: Cranial nerves II through XII are intact, neurovascularly intact in all  extremities with 2+ DTRs and 2+ pulses.  Lymph: No lymphadenopathy of posterior or anterior cervical chain or axillae bilaterally.  Gait normal with good balance and coordination.  MSK:  Non tender with full range of motion and good stability and symmetric strength and tone of shoulders, elbows, wrist and kneebilaterally.   Ankle:right She does have some mild enlargement wear the calf and the peroneal tendons me on the right side compared to the contralateral side when standing. Range of motion is full in all directions. Strength is 5/5 in all directions. Stable lateral and medial ligaments; squeeze test and kleiger test unremarkable; Talar dome nontender; No pain at base of 5th MT; No tenderness over cuboid; No tenderness over N spot or navicular prominence No tenderness on posterior aspects of lateral and medial malleolus Nontender over the peroneal tendons Patient is tender to palpation over the lateral fibula still present. Negative tarsal tunnel tinel's Able to walk 4 steps. Contralateral ankle unremarkable  Limited muscular skeletal ultrasound was performed Hulan Saas, M Limited ultrasound the patient's ankle shows the patient does have increasing Doppler flow as well as mild hypoechoic changes on the distal fibula. No cortical defect noted but patient does have thickening. This is a proximal 4 cm from the distal aspect of the fibula. Increasing Doppler flow noted  Impression: stress reaction with callus formation of the lateral fibula approximate 4 cm from distal aspect.   Impression and Recommendations:     This case required medical decision making of moderate complexity.

## 2014-08-16 NOTE — Patient Instructions (Signed)
Good to see you Ice is your friend after activity Biking, yoga and elliptical good for 1 week after that 2 times a week max Start a walk-run progression: - Initially start one minute walking than one minute running for 20 mins in the first week,   then 25 mins during the second week, then 30 mins afterwards.  Once you have reached 30 mins: - Run 2 mins, then walk 1 min. -Then run 3 mins, and walk 1 min. -Then run 4 mins, and walk 1 min. -Then run 5 mins, and walk 1 min. -Slowly build up weekly to running 30 mins nonstop.  If painful at any of the steps, back up one step.  Remember we have til July  See you again in 3 weeks.

## 2014-09-06 ENCOUNTER — Encounter: Payer: Self-pay | Admitting: Family Medicine

## 2014-09-06 ENCOUNTER — Ambulatory Visit (INDEPENDENT_AMBULATORY_CARE_PROVIDER_SITE_OTHER): Payer: BLUE CROSS/BLUE SHIELD | Admitting: Family Medicine

## 2014-09-06 VITALS — BP 120/70 | HR 72 | Ht 62.0 in | Wt 138.0 lb

## 2014-09-06 DIAGNOSIS — M7630 Iliotibial band syndrome, unspecified leg: Secondary | ICD-10-CM | POA: Insufficient documentation

## 2014-09-06 NOTE — Patient Instructions (Signed)
Good to see you Ice 20 minutes 2 times daily. Usually after activity and before bed. Exercises on wall.  Heel and butt touching.  Raise leg 6 inches and hold 2 seconds.  Down slow for count of 4 seconds.  1 set of 30 reps daily on both sides.  Other exercises 2-3 times a week Continue with cycling OK to run 2-3 times a week and can progress See me again in 2-3 weeks.

## 2014-09-06 NOTE — Progress Notes (Signed)
Pre visit review using our clinic review tool, if applicable. No additional management support is needed unless otherwise documented below in the visit note. 

## 2014-09-06 NOTE — Assessment & Plan Note (Signed)
Patient is having more of an iliotibial band syndrome and I think is causing the discomfort. We discussed icing regimen and home exercises. We discussed hip abductor strengthening being that this is likely the cause. I do think that due to this weakness as well as is probably why patient had the stress fracture in the first place. We discussed the importance of doing his exercises as well as how this will help patient's efficiency with her running. We'll try to make these changes and was given home exercises and patient will come back again in 3-4 weeks. Depending on patient response she may also be a candidate for osteopathic manipulation and we'll discuss at follow-up.  Spent  25 minutes with patient face-to-face and had greater than 50% of counseling including as described above in assessment and plan.

## 2014-09-06 NOTE — Progress Notes (Signed)
  [name] [address] [place], [place] [address] Phone: [phone] Subjective:      CC: Left leg pain   [id number] Carrie Morrison is a 34 y.o. female coming in with complaint of left leg pain.  Patient was doing increasing running and unfortunately did have what seemed to be a stress fracture of the fibula. Patient was given a brace, home exercises, icing protocol and was to avoid running for short course of time. Patient states she has not been running and does feel that it is better. Patient is not having any pain there. Patient states that when she starts increasing her activity she does feel that she has pain on the lateral aspect of the knee. Patient states that this seems to radiate up towards her hip. Patient still continues to do more biking than anything else. Patient continues to remain active. Denies any instability of the knee.     Past medical history, social, surgical and family history all reviewed in electronic medical record.   Review of Systems: No headache, visual changes, nausea, vomiting, diarrhea, constipation, dizziness, abdominal pain, skin rash, fevers, chills, night sweats, weight loss, swollen lymph nodes, body aches, joint swelling, muscle aches, chest pain, shortness of breath, mood changes.   Objective Blood pressure 120/70, pulse 72, height 5\' 2"  (1.575 m), weight 138 lb (62.596 kg), SpO2 98 %.  General: No apparent distress alert and oriented x3 mood and affect normal, dressed appropriately patient does appear ill.  HEENT: Pupils equal, extraocular movements intact  Respiratory: Patient's speak in full sentences and does not appear short of breath  Cardiovascular: No lower extremity edema, non tender, no erythema  Skin: Warm dry intact with no signs of infection or rash on extremities or on axial skeleton.  Abdomen: Soft nontender  Neuro: Cranial nerves II through XII are intact, neurovascularly intact in all extremities  with 2+ DTRs and 2+ pulses.  Lymph: No lymphadenopathy of posterior or anterior cervical chain or axillae bilaterally.  Gait normal with good balance and coordination.  MSK:  Non tender with full range of motion and good stability and symmetric strength and tone of shoulders, elbows, wrist and kneebilaterally.   Knee:right Normal to inspection with no erythema or effusion or obvious bony abnormalities. Palpation normal with no warmth, joint line tenderness, patellar tenderness, or condyle tenderness.tender over the iliotibial. But symmetric ROM full in flexion and extension and lower leg rotation. Ligaments with solid consistent endpoints including ACL, PCL, LCL, MCL. Negative Mcmurray's, Apley's, and Thessalonian tests. Non painful patellar compression. Patellar glide without crepitus. Patellar and quadriceps tendons unremarkable. Hamstring and quadriceps strength is normal.  Tender to palpation near the distal iliotibial band. Severe weakness of the hip abductors with 3+ out of 5 bilaterally. Contralateral knee he has tenderness over the iliotibial band but otherwise unremarkable.    Impression and Recommendations:     This case required medical decision making of moderate complexity.

## 2014-09-27 ENCOUNTER — Encounter: Payer: Self-pay | Admitting: Family Medicine

## 2014-09-27 ENCOUNTER — Ambulatory Visit (INDEPENDENT_AMBULATORY_CARE_PROVIDER_SITE_OTHER): Payer: BLUE CROSS/BLUE SHIELD | Admitting: Family Medicine

## 2014-09-27 VITALS — BP 108/72 | HR 74 | Ht 62.0 in | Wt 138.0 lb

## 2014-09-27 DIAGNOSIS — M9903 Segmental and somatic dysfunction of lumbar region: Secondary | ICD-10-CM | POA: Diagnosis not present

## 2014-09-27 DIAGNOSIS — M999 Biomechanical lesion, unspecified: Secondary | ICD-10-CM | POA: Insufficient documentation

## 2014-09-27 DIAGNOSIS — M9902 Segmental and somatic dysfunction of thoracic region: Secondary | ICD-10-CM | POA: Diagnosis not present

## 2014-09-27 DIAGNOSIS — M7630 Iliotibial band syndrome, unspecified leg: Secondary | ICD-10-CM

## 2014-09-27 DIAGNOSIS — M9901 Segmental and somatic dysfunction of cervical region: Secondary | ICD-10-CM | POA: Diagnosis not present

## 2014-09-27 NOTE — Assessment & Plan Note (Signed)
Patient is doing much better with the exercises but I do think alignment is also contribute in. Patient did have osteopathic manipulation done today which was beneficial. We encouraged patient to continue to work on hip abductor strengthening. Patient will try to make these different changes and come back and see me again in 3-4 weeks for further evaluation and treatment.

## 2014-09-27 NOTE — Assessment & Plan Note (Signed)
Decision today to treat with OMT was based on Physical Exam  After verbal consent patient was treated with HVLA, ME techniques in cervical, thoracic and lumbar areas  Patient tolerated the procedure well with improvement in symptoms  Patient given exercises, stretches and lifestyle modifications  See medications in patient instructions if given  Patient will follow up in 3-4 weeks

## 2014-09-27 NOTE — Patient Instructions (Signed)
Good to see you Don't change a thing The reigns and off and run as much as you want safely.  Try new exercise on side and try to get the outside of hips stronger Continue the vitamns If manipulation helps see me agai in 4 weeks.

## 2014-09-27 NOTE — Progress Notes (Signed)
  [facility] [address] [place], [place] [address] Phone: [phone] Subjective:      CC: Left leg pain   [id number] Carrie Morrison is a 34 y.o. female coming in with complaint of left leg pain.  Patient was doing increasing running and unfortunately did have what seemed to be a stress fracture of the fibula. Patient states that she still has some mild discomfort from time to time but she is able to run without any significant pain. Nothing that his stopping her from any activities and denies any nighttime awakening. States that it only hurts to pressure. Continues to increase her running. Patient recently did do a 5K and [employer].       Past medical history, social, surgical and family history all reviewed in electronic medical record.   Review of Systems: No headache, visual changes, nausea, vomiting, diarrhea, constipation, dizziness, abdominal pain, skin rash, fevers, chills, night sweats, weight loss, swollen lymph nodes, body aches, joint swelling, muscle aches, chest pain, shortness of breath, mood changes.   Objective There were no vitals taken for this visit.  General: No apparent distress alert and oriented x3 mood and affect normal, dressed appropriately patient does appear ill.  HEENT: Pupils equal, extraocular movements intact  Respiratory: Patient's speak in full sentences and does not appear short of breath  Cardiovascular: No lower extremity edema, non tender, no erythema  Skin: Warm dry intact with no signs of infection or rash on extremities or on axial skeleton.  Abdomen: Soft nontender  Neuro: Cranial nerves II through XII are intact, neurovascularly intact in all extremities with 2+ DTRs and 2+ pulses.  Lymph: No lymphadenopathy of posterior or anterior cervical chain or axillae bilaterally.  Gait normal with good balance and coordination.  MSK:  Non tender with full range of motion and good stability and symmetric strength  and tone of shoulders, elbows, wrist and kneebilaterally.   Knee:right Normal to inspection with no erythema or effusion or obvious bony abnormalities. Palpation normal with no warmth, joint line tenderness, patellar tenderness, or condyle tenderness.tender over the iliotibial. But symmetric ROM full in flexion and extension and lower leg rotation. Ligaments with solid consistent endpoints including ACL, PCL, LCL, MCL. Negative Mcmurray's, Apley's, and Thessalonian tests. Non painful patellar compression. Patellar glide without crepitus. Patellar and quadriceps tendons unremarkable. Hamstring and quadriceps strength is normal.  Tender to palpation near the distal iliotibial band. Continue weakness of the hip abductors bilaterally. Contralateral knee he has tenderness over the iliotibial band but otherwise unremarkable.  Osteopathic findings Cervical C2 flexed rotated and side bent right Thoracic T3 extended rotated and side bent right Lumbar L2 flexed rotated and side bent right  Impression and Recommendations:     This case required medical decision making of moderate complexity.

## 2014-09-27 NOTE — Progress Notes (Signed)
Pre visit review using our clinic review tool, if applicable. No additional management support is needed unless otherwise documented below in the visit note. 

## 2014-11-01 ENCOUNTER — Ambulatory Visit (INDEPENDENT_AMBULATORY_CARE_PROVIDER_SITE_OTHER): Payer: BLUE CROSS/BLUE SHIELD | Admitting: Family Medicine

## 2014-11-01 ENCOUNTER — Encounter: Payer: Self-pay | Admitting: Family Medicine

## 2014-11-01 VITALS — BP 108/72 | HR 68 | Ht 62.0 in | Wt 136.0 lb

## 2014-11-01 DIAGNOSIS — M9902 Segmental and somatic dysfunction of thoracic region: Secondary | ICD-10-CM

## 2014-11-01 DIAGNOSIS — M7630 Iliotibial band syndrome, unspecified leg: Secondary | ICD-10-CM

## 2014-11-01 DIAGNOSIS — M999 Biomechanical lesion, unspecified: Secondary | ICD-10-CM

## 2014-11-01 DIAGNOSIS — M549 Dorsalgia, unspecified: Secondary | ICD-10-CM | POA: Diagnosis not present

## 2014-11-01 NOTE — Assessment & Plan Note (Signed)
Decision today to treat with OMT was based on Physical Exam  After verbal consent patient was treated with HVLA, ME techniques in cervical, thoracic and lumbar areas  Patient tolerated the procedure well with improvement in symptoms  Patient given exercises, stretches and lifestyle modifications  See medications in patient instructions if given  Patient will follow up in 4 weeks

## 2014-11-01 NOTE — Progress Notes (Signed)
Pre visit review using our clinic review tool, if applicable. No additional management support is needed unless otherwise documented below in the visit note. 

## 2014-11-01 NOTE — Assessment & Plan Note (Signed)
Continues to respond well to osteopathic manipulation and continue to work on core strengthening hip abductors strengthening. We discussed stabilization techniques as well as balance and coordination techniques. Patient will start increasing her activity and we'll have her follow-up again in 4 weeks for further evaluation and trigger.

## 2014-11-01 NOTE — Progress Notes (Signed)
  [name] [address] [place], [place] [address] Phone: [phone] Subjective:      CC: Left leg pain   [id number] Carrie Morrison is a 34 y.o. female coming in with complaint of left leg pain.  Patient was seen previously and had more of a distal iliotibial band syndrome. Patient has been on vacation with the last 2 weeks and not doing as much running. Patient has no significant improvement overall. States that it is very minimal. Has been running somewhat since she's been back in a healing better. Discussed icing regimen which patient is been doing intermittently. Patient continues with the natural vitamins. States that her back pain and hip pain seems to be improving as long as she has manipulation on a fairly regular basis.       Past medical history, social, surgical and family history all reviewed in electronic medical record.   Review of Systems: No headache, visual changes, nausea, vomiting, diarrhea, constipation, dizziness, abdominal pain, skin rash, fevers, chills, night sweats, weight loss, swollen lymph nodes, body aches, joint swelling, muscle aches, chest pain, shortness of breath, mood changes.   Objective Blood pressure 108/72, pulse 68, height 5\' 2"  (1.575 m), weight 136 lb (61.689 kg), SpO2 99 %.  General: No apparent distress alert and oriented x3 mood and affect normal, dressed appropriately patient does appear ill.  HEENT: Pupils equal, extraocular movements intact  Respiratory: Patient's speak in full sentences and does not appear short of breath  Cardiovascular: No lower extremity edema, non tender, no erythema  Skin: Warm dry intact with no signs of infection or rash on extremities or on axial skeleton.  Abdomen: Soft nontender  Neuro: Cranial nerves II through XII are intact, neurovascularly intact in all extremities with 2+ DTRs and 2+ pulses.  Lymph: No lymphadenopathy of posterior or anterior cervical chain or axillae  bilaterally.  Gait normal with good balance and coordination.  MSK:  Non tender with full range of motion and good stability and symmetric strength and tone of shoulders, elbows, wrist and kneebilaterally.   Knee:right Normal to inspection with no erythema or effusion or obvious bony abnormalities. Palpation normal with no warmth, joint line tenderness, patellar tenderness, or condyle tenderness.tender over the iliotibial. But symmetric ROM full in flexion and extension and lower leg rotation. Ligaments with solid consistent endpoints including ACL, PCL, LCL, MCL. Negative Mcmurray's, Apley's, and Thessalonian tests. Non painful patellar compression. Patellar glide without crepitus. Patellar and quadriceps tendons unremarkable. Hamstring and quadriceps strength is normal.  Less Tender over the distal iliotibial band bilaterally I'll improvement in hip abductors but minimal Contralateral knee he has tenderness over the iliotibial band but otherwise unremarkable.  Osteopathic findings Cervical C2 flexed rotated and side bent right Thoracic T3 extended rotated and side bent right Lumbar L2 flexed rotated and side bent right Sacrum Left on left  Impression and Recommendations:     This case required medical decision making of moderate complexity.

## 2014-11-01 NOTE — Patient Instructions (Addendum)
Good to see you Ice when you need it.  Get back to the routine.  COnintue on the hip abductors HAppy 4th! See me again in 4 weeks.

## 2014-11-01 NOTE — Assessment & Plan Note (Signed)
Seems to be improving at this time. I do think patient fatigued from time to time it can give her some difficulty. Patient come back and see me again in 4 weeks.

## 2014-11-29 ENCOUNTER — Ambulatory Visit (INDEPENDENT_AMBULATORY_CARE_PROVIDER_SITE_OTHER): Payer: BLUE CROSS/BLUE SHIELD | Admitting: Family Medicine

## 2014-11-29 ENCOUNTER — Encounter: Payer: Self-pay | Admitting: Family Medicine

## 2014-11-29 VITALS — BP 110/74 | HR 75 | Ht 62.0 in | Wt 136.0 lb

## 2014-11-29 DIAGNOSIS — M999 Biomechanical lesion, unspecified: Secondary | ICD-10-CM

## 2014-11-29 DIAGNOSIS — M549 Dorsalgia, unspecified: Secondary | ICD-10-CM

## 2014-11-29 DIAGNOSIS — M9901 Segmental and somatic dysfunction of cervical region: Secondary | ICD-10-CM

## 2014-11-29 DIAGNOSIS — M9902 Segmental and somatic dysfunction of thoracic region: Secondary | ICD-10-CM

## 2014-11-29 DIAGNOSIS — M9903 Segmental and somatic dysfunction of lumbar region: Secondary | ICD-10-CM | POA: Diagnosis not present

## 2014-11-29 NOTE — Progress Notes (Signed)
Pre visit review using our clinic review tool, if applicable. No additional management support is needed unless otherwise documented below in the visit note. 

## 2014-11-29 NOTE — Patient Instructions (Addendum)
Too easy You are doing great Keep increasing as much as you want.  Continue everything else including core.  See me again in 6 weeks.

## 2014-11-29 NOTE — Assessment & Plan Note (Signed)
Patient is doing much better as long she continues to stretch her hip flexors. Discussed with the protein supplementation and continuing the over-the-counter natural supplementations. Patient continues to run on a regular regimen and is increasing her mileage. Patient will come back and see me again in 5-6 weeks for further evaluation and treatment.

## 2014-11-29 NOTE — Progress Notes (Signed)
  [name] [address] [place], [place] [address] Phone: [phone] Subjective:     CC: Left leg pain and back pain.   [id number] Carrie Morrison is a 34 y.o. female coming in with complaint of left leg pain.  Patient was seen previously and had more of a distal iliotibial band syndrome.  Patient is also had some mild mid back pain. Patient has been responding very well to osteopathic manipulation for alignment issues including the gait. Patient does have orthotics and continues to run on a regular basis. Patient states she is in the middle to increase her running to up to 10 miles at a time. Not having any significant discomfort. States that the ankle pain is significantly better as well. States that everything else seems to be doing very well with the discomfort in her back as well. Continues to do the stretching and working on her core.     Past medical history, social, surgical and family history all reviewed in electronic medical record.   Review of Systems: No headache, visual changes, nausea, vomiting, diarrhea, constipation, dizziness, abdominal pain, skin rash, fevers, chills, night sweats, weight loss, swollen lymph nodes, body aches, joint swelling, muscle aches, chest pain, shortness of breath, mood changes.   Objective Blood pressure 110/74, pulse 75, height 5\' 2"  (1.575 m), weight 136 lb (61.689 kg), SpO2 99 %.  General: No apparent distress alert and oriented x3 mood and affect normal, dressed appropriately patient does appear ill.  HEENT: Pupils equal, extraocular movements intact  Respiratory: Patient's speak in full sentences and does not appear short of breath  Cardiovascular: No lower extremity edema, non tender, no erythema  Skin: Warm dry intact with no signs of infection or rash on extremities or on axial skeleton.  Abdomen: Soft nontender  Neuro: Cranial nerves II through XII are intact, neurovascularly intact in all extremities  with 2+ DTRs and 2+ pulses.  Lymph: No lymphadenopathy of posterior or anterior cervical chain or axillae bilaterally.  Gait normal with good balance and coordination.  MSK:  Non tender with full range of motion and good stability and symmetric strength and tone of shoulders, elbows, wrist and kneebilaterally.   Knee:right Normal to inspection with no erythema or effusion or obvious bony abnormalities. Palpation normal with no warmth, joint line tenderness, patellar tenderness, or condyle tenderness.tender over the iliotibial. But symmetric ROM full in flexion and extension and lower leg rotation. Ligaments with solid consistent endpoints including ACL, PCL, LCL, MCL. Negative Mcmurray's, Apley's, and Thessalonian tests. Non painful patellar compression. Patellar glide without crepitus. Patellar and quadriceps tendons unremarkable. Hamstring and quadriceps strength is normal.  Nontender at all over the distal iliotibial band Contralateral knee he has tenderness over the iliotibial band but otherwise unremarkable.  Osteopathic findings Cervical C2 flexed rotated and side bent right Thoracic T3 extended rotated and side bent right Lumbar L2 flexed rotated and side bent right Sacrum Left on left  Impression and Recommendations:     This case required medical decision making of moderate complexity.

## 2015-01-10 ENCOUNTER — Ambulatory Visit: Payer: BLUE CROSS/BLUE SHIELD | Admitting: Family Medicine

## 2015-01-24 ENCOUNTER — Ambulatory Visit (INDEPENDENT_AMBULATORY_CARE_PROVIDER_SITE_OTHER): Payer: BLUE CROSS/BLUE SHIELD | Admitting: Family Medicine

## 2015-01-24 ENCOUNTER — Encounter: Payer: Self-pay | Admitting: Family Medicine

## 2015-01-24 VITALS — BP 104/72 | HR 72 | Ht 62.0 in | Wt 136.0 lb

## 2015-01-24 DIAGNOSIS — M549 Dorsalgia, unspecified: Secondary | ICD-10-CM

## 2015-01-24 DIAGNOSIS — M999 Biomechanical lesion, unspecified: Secondary | ICD-10-CM

## 2015-01-24 DIAGNOSIS — M9903 Segmental and somatic dysfunction of lumbar region: Secondary | ICD-10-CM

## 2015-01-24 DIAGNOSIS — M9902 Segmental and somatic dysfunction of thoracic region: Secondary | ICD-10-CM

## 2015-01-24 DIAGNOSIS — M9901 Segmental and somatic dysfunction of cervical region: Secondary | ICD-10-CM | POA: Diagnosis not present

## 2015-01-24 NOTE — Assessment & Plan Note (Signed)
Doing remarkably well no changes. Return to clinic in 6 weeks

## 2015-01-24 NOTE — Patient Instructions (Addendum)
Good to see you Carrie Morrison is your friend Tell your girlfriend to go easy on you ;) Your perfect and do everything you are doing.  See me again in 2 months unless back at trampoline park

## 2015-01-24 NOTE — Progress Notes (Signed)
Pre visit review using our clinic review tool, if applicable. No additional management support is needed unless otherwise documented below in the visit note. 

## 2015-01-24 NOTE — Progress Notes (Signed)
  [name] [address] [place], [place] [address] Phone: [phone] Subjective:     CC:  back pain follow-up.   [id number] Carrie Morrison is a 34 y.o. female coming in with complaint of back pain Patient is also had some mild mid back pain. Patient has been responding very well to osteopathic manipulation for muscle imbalances. Patient is an avid runner and has been trying to stretch her hip flexors. Patient states she is doing very well overall. Patient states some mild tightness from time to time but nothing that is stopping her from activity. Patient continues to be able to be very active. Denies any pain medicines stopping her. Patient does state that she did leg work out the other day and she has had some stiffness in her legs but very minimal and feels more like muscle soreness.     Past medical history, social, surgical and family history all reviewed in electronic medical record.   Review of Systems: No headache, visual changes, nausea, vomiting, diarrhea, constipation, dizziness, abdominal pain, skin rash, fevers, chills, night sweats, weight loss, swollen lymph nodes, body aches, joint swelling, muscle aches, chest pain, shortness of breath, mood changes.   Objective Blood pressure 104/72, pulse 72, height 5\' 2"  (1.575 m), weight 136 lb (61.689 kg), SpO2 98 %.  General: No apparent distress alert and oriented x3 mood and affect normal, dressed appropriately patient does appear ill.  HEENT: Pupils equal, extraocular movements intact  Respiratory: Patient's speak in full sentences and does not appear short of breath  Cardiovascular: No lower extremity edema, non tender, no erythema  Skin: Warm dry intact with no signs of infection or rash on extremities or on axial skeleton.  Abdomen: Soft nontender  Neuro: Cranial nerves II through XII are intact, neurovascularly intact in all extremities with 2+ DTRs and 2+ pulses.  Lymph: No lymphadenopathy  of posterior or anterior cervical chain or axillae bilaterally.  Gait normal with good balance and coordination.  MSK:  Non tender with full range of motion and good stability and symmetric strength and tone of shoulders, elbows, wrist and kneebilaterally.  Back Exam:  Inspection: Unremarkable  Motion: Flexion 45 deg, Extension 45 deg, Side Bending to 45 deg bilaterally,  Rotation to 45 deg bilaterally  SLR laying: Negative  XSLR laying: Negative  Palpable tenderness: tightness of hamstrings which is new. FABER: negative. Sensory change: Gross sensation intact to all lumbar and sacral dermatomes.  Reflexes: 2+ at both patellar tendons, 2+ at achilles tendons, Babinski's downgoing.  Strength at foot  Plantar-flexion: 5/5 Dorsi-flexion: 5/5 Eversion: 5/5 Inversion: 5/5  Leg strength  Quad: 5/5 Hamstring: 5/5 Hip flexor: 5/5 Hip abductors: 5/5  Gait unremarkable.   Osteopathic findings Cervical C2 flexed rotated and side bent right Thoracic T3 extended rotated and side bent right Lumbar L2 flexed rotated and side bent right Sacrum Left on left Same pattern as previous  Impression and Recommendations:     This case required medical decision making of moderate complexity.

## 2015-01-24 NOTE — Assessment & Plan Note (Signed)
Decision today to treat with OMT was based on Physical Exam  After verbal consent patient was treated with HVLA, ME techniques in cervical, thoracic and lumbar areas  Patient tolerated the procedure well with improvement in symptoms  Patient given exercises, stretches and lifestyle modifications  See medications in patient instructions if given  Patient will follow up in 6-8 weeks

## 2015-03-20 ENCOUNTER — Ambulatory Visit (INDEPENDENT_AMBULATORY_CARE_PROVIDER_SITE_OTHER): Payer: BLUE CROSS/BLUE SHIELD | Admitting: Gynecology

## 2015-03-20 ENCOUNTER — Encounter: Payer: Self-pay | Admitting: Gynecology

## 2015-03-20 VITALS — BP 116/76

## 2015-03-20 DIAGNOSIS — R102 Pelvic and perineal pain: Secondary | ICD-10-CM

## 2015-03-20 DIAGNOSIS — N921 Excessive and frequent menstruation with irregular cycle: Secondary | ICD-10-CM | POA: Diagnosis not present

## 2015-03-20 NOTE — Patient Instructions (Signed)
Follow up for ultrasound as scheduled 

## 2015-03-20 NOTE — Progress Notes (Signed)
Carrie Morrison 15-Jun-1980 [id number]        34 y.o.  G1P0101 Presents complaining of consistent breakthrough bleeding throughout the month. Having regular monthly menses but breakthrough bleeding afterwards. Was having just some breakthrough bleeding after running but now seems just happened spontaneously.  Also notes some left lower quadrant discomfort that comes and goes. History of prior ovarian cysts and she was wondering whether she had one now. No nausea vomiting diarrhea constipation. Had similar complaints 09/2013.  Lab work at that time showed normal hemoglobin prolactin and TSH. Was scheduled for ultrasound but she canceled it never followed up with this.  Past medical history,surgical history, problem list, medications, allergies, family history and social history were all reviewed and documented in the EPIC chart.  Directed ROS with pertinent positives and negatives documented in the history of present illness/assessment and plan.  Exam: Kim assistant Filed Vitals:   03/20/15 0942  BP: 116/76   General appearance:  Normal Abdomen soft nontender without masses guarding rebound Pelvic external BUS vagina normal. Cervix normal. Uterus anteverted normal size midline mobile nontender. Adnexa without masses or tenderness.  Assessment/Plan:  34 y.o. G1P0101 with history as above. Recommend sonohysterogram rule out ovarian disease and for endometrial assessment. Patient will schedule follow up for this. She also is overdue for her annual exam and has this scheduled also.    Anastasio Auerbach MD, 9:54 AM 03/20/2015

## 2015-03-26 ENCOUNTER — Ambulatory Visit: Payer: BLUE CROSS/BLUE SHIELD | Admitting: Family Medicine

## 2015-05-23 ENCOUNTER — Other Ambulatory Visit (HOSPITAL_COMMUNITY)
Admission: RE | Admit: 2015-05-23 | Discharge: 2015-05-23 | Disposition: A | Discharge status: Home / Self Care | Payer: BLUE CROSS/BLUE SHIELD | Source: Ambulatory Visit | Attending: Gynecology | Admitting: Gynecology

## 2015-05-23 ENCOUNTER — Ambulatory Visit (INDEPENDENT_AMBULATORY_CARE_PROVIDER_SITE_OTHER): Payer: BLUE CROSS/BLUE SHIELD | Admitting: Gynecology

## 2015-05-23 ENCOUNTER — Encounter: Payer: Self-pay | Admitting: Gynecology

## 2015-05-23 VITALS — BP 116/74 | Ht 64.0 in | Wt 141.0 lb

## 2015-05-23 DIAGNOSIS — N926 Irregular menstruation, unspecified: Secondary | ICD-10-CM

## 2015-05-23 DIAGNOSIS — Z13220 Encounter for screening for lipoid disorders: Secondary | ICD-10-CM | POA: Diagnosis not present

## 2015-05-23 DIAGNOSIS — R102 Pelvic and perineal pain: Secondary | ICD-10-CM | POA: Diagnosis not present

## 2015-05-23 DIAGNOSIS — Z01419 Encounter for gynecological examination (general) (routine) without abnormal findings: Secondary | ICD-10-CM

## 2015-05-23 LAB — CBC WITH DIFFERENTIAL/PLATELET
BASOS ABS: 0.1 10*3/uL (ref 0.0–0.1)
Basophils Relative: 1 % (ref 0–1)
EOS ABS: 0.3 10*3/uL (ref 0.0–0.7)
Eosinophils Relative: 5 % (ref 0–5)
HCT: 42.9 % (ref 36.0–46.0)
Hemoglobin: 14.2 g/dL (ref 12.0–15.0)
Lymphocytes Relative: 32 % (ref 12–46)
Lymphs Abs: 2.1 10*3/uL (ref 0.7–4.0)
MCH: 31.8 pg (ref 26.0–34.0)
MCHC: 33.1 g/dL (ref 30.0–36.0)
MCV: 96.2 fL (ref 78.0–100.0)
MONOS PCT: 7 % (ref 3–12)
MPV: 10.3 fL (ref 8.6–12.4)
Monocytes Absolute: 0.5 10*3/uL (ref 0.1–1.0)
Neutro Abs: 3.6 10*3/uL (ref 1.7–7.7)
Neutrophils Relative %: 55 % (ref 43–77)
PLATELETS: 375 10*3/uL (ref 150–400)
RBC: 4.46 MIL/uL (ref 3.87–5.11)
RDW: 12.4 % (ref 11.5–15.5)
WBC: 6.5 10*3/uL (ref 4.0–10.5)

## 2015-05-23 LAB — COMPREHENSIVE METABOLIC PANEL
ALK PHOS: 58 U/L (ref 33–115)
ALT: 14 U/L (ref 6–29)
AST: 19 U/L (ref 10–30)
Albumin: 4.2 g/dL (ref 3.6–5.1)
BILIRUBIN TOTAL: 0.8 mg/dL (ref 0.2–1.2)
BUN: 9 mg/dL (ref 7–25)
CO2: 27 mmol/L (ref 20–31)
CREATININE: 0.76 mg/dL (ref 0.50–1.10)
Calcium: 9.7 mg/dL (ref 8.6–10.2)
Chloride: 102 mmol/L (ref 98–110)
GLUCOSE: 82 mg/dL (ref 65–99)
Potassium: 4.2 mmol/L (ref 3.5–5.3)
SODIUM: 137 mmol/L (ref 135–146)
Total Protein: 6.9 g/dL (ref 6.1–8.1)

## 2015-05-23 LAB — LIPID PANEL
CHOL/HDL RATIO: 2.4 ratio (ref <=5.0)
Cholesterol: 182 mg/dL (ref 125–200)
HDL: 76 mg/dL (ref >=46)
LDL Cholesterol: 90 mg/dL (ref <130)
Triglycerides: 81 mg/dL (ref <150)
VLDL: 16 mg/dL (ref <30)

## 2015-05-23 LAB — TSH: TSH: 1.744 u[IU]/mL (ref 0.350–4.500)

## 2015-05-23 NOTE — Progress Notes (Signed)
Carrie Morrison [birth year] [id number]        34 y.o.  G1P0101  for annual exam.  Several issues noted below.  Past medical history,surgical history, problem list, medications, allergies, family history and social history were all reviewed and documented as reviewed in the EPIC chart.  ROS:  Performed with pertinent positives and negatives included in the history, assessment and plan.   Additional significant findings :  none   Exam: Gilman Schmidt assistant Filed Vitals:   05/23/15 1035  BP: 116/74  Height: 5\' 4"  (1.626 m)  Weight: 141 lb (63.957 kg)   General appearance:  Normal affect, orientation and appearance. Skin: Grossly normal HEENT: Without gross lesions.  No cervical or supraclavicular adenopathy. Thyroid normal.  Lungs:  Clear without wheezing, rales or rhonchi Cardiac: RR, without RMG Abdominal:  Soft, nontender, without masses, guarding, rebound, organomegaly or hernia Breasts:  Examined lying and sitting without masses, retractions, discharge or axillary adenopathy. Pelvic:  Ext/BUS/vagina normal  Cervix normal  Uterus anteverted, normal size, shape and contour, midline and mobile nontender   Adnexa  Without masses or tenderness    Anus and perineum  Normal   Rectovaginal  Normal sphincter tone without palpated masses or tenderness.    Assessment/Plan:  35 y.o. G21P0101 female for annual exam with regular menses, no contraception.   1. Breakthrough bleeding/pelvic pain. Patient was seen in November complaining of intermittent breakthrough bleeding during the month and left-sided pelvic pain. She has a history of ovarian cysts in the past. Breakthrough bleeding has continued as has her pain. Occurs intermittently with aching to sharp stabbing. Was to schedule a sonohysterogram but never followed up for this. Recommend that she schedule sonohysterogram rule out ovarian process as well as endometrial abnormalities and patient agrees to schedule. Check baseline TSH today and  CBC. Prior prolactin negative. 2. Contraception. Not actively using contraception and would accept pregnancy if occurs. Prenatal vitamin preconceptionally recommended. 3. Screening mammographic recommendations between 97 and 40 reviewed. Without strong family history of breast cancer. Prefers to wait closer to 40. SBE monthly reviewed. 4. Pap smear/HPV negative 2013. Pap smear done today. No history of abnormal Pap smears previously. 5. Health maintenance. Baseline CBC, comprehensive metabolic panel, lipid profile, urinalysis, TSH ordered.  Follow up for sonohysterogram as scheduled.   Anastasio Auerbach MD, 11:03 AM 05/23/2015

## 2015-05-23 NOTE — Addendum Note (Signed)
Addended by: Nelva Nay on: 05/23/2015 11:29 AM   Modules accepted: Orders

## 2015-05-23 NOTE — Patient Instructions (Signed)
Follow up for ultrasound as scheduled.  You may obtain a copy of any labs that were done today by logging onto MyChart as outlined in the instructions provided with your AVS (after visit summary). The office will not call with normal lab results but certainly if there are any significant abnormalities then we will contact you.   Health Maintenance Adopting a healthy lifestyle and getting preventive care can go a long way to promote health and wellness. Talk with your health care provider about what schedule of regular examinations is right for you. This is a good chance for you to check in with your provider about disease prevention and staying healthy. In between checkups, there are plenty of things you can do on your own. Experts have done a lot of research about which lifestyle changes and preventive measures are most likely to keep you healthy. Ask your health care provider for more information. WEIGHT AND DIET  Eat a healthy diet  Be sure to include plenty of vegetables, fruits, low-fat dairy products, and lean protein.  Do not eat a lot of foods high in solid fats, added sugars, or salt.  Get regular exercise. This is one of the most important things you can do for your health.  Most adults should exercise for at least 150 minutes each week. The exercise should increase your heart rate and make you sweat (moderate-intensity exercise).  Most adults should also do strengthening exercises at least twice a week. This is in addition to the moderate-intensity exercise.  Maintain a healthy weight  Body mass index (BMI) is a measurement that can be used to identify possible weight problems. It estimates body fat based on height and weight. Your health care provider can help determine your BMI and help you achieve or maintain a healthy weight.  For females 74 years of age and older:   A BMI below 18.5 is considered underweight.  A BMI of 18.5 to 24.9 is normal.  A BMI of 25 to 29.9 is  considered overweight.  A BMI of 30 and above is considered obese.  Watch levels of cholesterol and blood lipids  You should start having your blood tested for lipids and cholesterol at 36 years of age, then have this test every 5 years.  You may need to have your cholesterol levels checked more often if:  Your lipid or cholesterol levels are high.  You are older than 36 years of age.  You are at high risk for heart disease.  CANCER SCREENING   Lung Cancer  Lung cancer screening is recommended for adults 24-9 years old who are at high risk for lung cancer because of a history of smoking.  A yearly low-dose CT scan of the lungs is recommended for people who:  Currently smoke.  Have quit within the past 15 years.  Have at least a 30-pack-year history of smoking. A pack year is smoking an average of one pack of cigarettes a day for 1 year.  Yearly screening should continue until it has been 15 years since you quit.  Yearly screening should stop if you develop a health problem that would prevent you from having lung cancer treatment.  Breast Cancer  Practice breast self-awareness. This means understanding how your breasts normally appear and feel.  It also means doing regular breast self-exams. Let your health care provider know about any changes, no matter how small.  If you are in your 20s or 30s, you should have a clinical breast exam (CBE)  by a health care provider every 1-3 years as part of a regular health exam.  If you are 22 or older, have a CBE every year. Also consider having a breast X-ray (mammogram) every year.  If you have a family history of breast cancer, talk to your health care provider about genetic screening.  If you are at high risk for breast cancer, talk to your health care provider about having an MRI and a mammogram every year.  Breast cancer gene (BRCA) assessment is recommended for women who have family members with BRCA-related cancers.  BRCA-related cancers include:  Breast.  Ovarian.  Tubal.  Peritoneal cancers.  Results of the assessment will determine the need for genetic counseling and BRCA1 and BRCA2 testing. Cervical Cancer Routine pelvic examinations to screen for cervical cancer are no longer recommended for nonpregnant women who are considered low risk for cancer of the pelvic organs (ovaries, uterus, and vagina) and who do not have symptoms. A pelvic examination may be necessary if you have symptoms including those associated with pelvic infections. Ask your health care provider if a screening pelvic exam is right for you.   The Pap test is the screening test for cervical cancer for women who are considered at risk.  If you had a hysterectomy for a problem that was not cancer or a condition that could lead to cancer, then you no longer need Pap tests.  If you are older than 65 years, and you have had normal Pap tests for the past 10 years, you no longer need to have Pap tests.  If you have had past treatment for cervical cancer or a condition that could lead to cancer, you need Pap tests and screening for cancer for at least 20 years after your treatment.  If you no longer get a Pap test, assess your risk factors if they change (such as having a new sexual partner). This can affect whether you should start being screened again.  Some women have medical problems that increase their chance of getting cervical cancer. If this is the case for you, your health care provider may recommend more frequent screening and Pap tests.  The human papillomavirus (HPV) test is another test that may be used for cervical cancer screening. The HPV test looks for the virus that can cause cell changes in the cervix. The cells collected during the Pap test can be tested for HPV.  The HPV test can be used to screen women 39 years of age and older. Getting tested for HPV can extend the interval between normal Pap tests from three to  five years.  An HPV test also should be used to screen women of any age who have unclear Pap test results.  After 36 years of age, women should have HPV testing as often as Pap tests.  Colorectal Cancer  This type of cancer can be detected and often prevented.  Routine colorectal cancer screening usually begins at 36 years of age and continues through 36 years of age.  Your health care provider may recommend screening at an earlier age if you have risk factors for colon cancer.  Your health care provider may also recommend using home test kits to check for hidden blood in the stool.  A small camera at the end of a tube can be used to examine your colon directly (sigmoidoscopy or colonoscopy). This is done to check for the earliest forms of colorectal cancer.  Routine screening usually begins at age 58.  Direct  examination of the colon should be repeated every 5-10 years through 36 years of age. However, you may need to be screened more often if early forms of precancerous polyps or small growths are found. Skin Cancer  Check your skin from head to toe regularly.  Tell your health care provider about any new moles or changes in moles, especially if there is a change in a mole's shape or color.  Also tell your health care provider if you have a mole that is larger than the size of a pencil eraser.  Always use sunscreen. Apply sunscreen liberally and repeatedly throughout the day.  Protect yourself by wearing long sleeves, pants, a wide-brimmed hat, and sunglasses whenever you are outside. HEART DISEASE, DIABETES, AND HIGH BLOOD PRESSURE   Have your blood pressure checked at least every 1-2 years. High blood pressure causes heart disease and increases the risk of stroke.  If you are between 26 years and 34 years old, ask your health care provider if you should take aspirin to prevent strokes.  Have regular diabetes screenings. This involves taking a blood sample to check your  fasting blood sugar level.  If you are at a normal weight and have a low risk for diabetes, have this test once every three years after 36 years of age.  If you are overweight and have a high risk for diabetes, consider being tested at a younger age or more often. PREVENTING INFECTION  Hepatitis B  If you have a higher risk for hepatitis B, you should be screened for this virus. You are considered at high risk for hepatitis B if:  You were born in a country where hepatitis B is common. Ask your health care provider which countries are considered high risk.  Your parents were born in a high-risk country, and you have not been immunized against hepatitis B (hepatitis B vaccine).  You have HIV or AIDS.  You use needles to inject street drugs.  You live with someone who has hepatitis B.  You have had sex with someone who has hepatitis B.  You get hemodialysis treatment.  You take certain medicines for conditions, including cancer, organ transplantation, and autoimmune conditions. Hepatitis C  Blood testing is recommended for:  Everyone born from [birth year] through 1966.  Anyone with known risk factors for hepatitis C. Sexually transmitted infections (STIs)  You should be screened for sexually transmitted infections (STIs) including gonorrhea and chlamydia if:  You are sexually active and are younger than 36 years of age.  You are older than 36 years of age and your health care provider tells you that you are at risk for this type of infection.  Your sexual activity has changed since you were last screened and you are at an increased risk for chlamydia or gonorrhea. Ask your health care provider if you are at risk.  If you do not have HIV, but are at risk, it may be recommended that you take a prescription medicine daily to prevent HIV infection. This is called pre-exposure prophylaxis (PrEP). You are considered at risk if:  You are sexually active and do not regularly use condoms or  know the HIV status of your partner(s).  You take drugs by injection.  You are sexually active with a partner who has HIV. Talk with your health care provider about whether you are at high risk of being infected with HIV. If you choose to begin PrEP, you should first be tested for HIV. You should then be tested  every 3 months for as long as you are taking PrEP.  PREGNANCY   If you are premenopausal and you may become pregnant, ask your health care provider about preconception counseling.  If you may become pregnant, take 400 to 800 micrograms (mcg) of folic acid every day.  If you want to prevent pregnancy, talk to your health care provider about birth control (contraception). OSTEOPOROSIS AND MENOPAUSE   Osteoporosis is a disease in which the bones lose minerals and strength with aging. This can result in serious bone fractures. Your risk for osteoporosis can be identified using a bone density scan.  If you are 67 years of age or older, or if you are at risk for osteoporosis and fractures, ask your health care provider if you should be screened.  Ask your health care provider whether you should take a calcium or vitamin D supplement to lower your risk for osteoporosis.  Menopause may have certain physical symptoms and risks.  Hormone replacement therapy may reduce some of these symptoms and risks. Talk to your health care provider about whether hormone replacement therapy is right for you.  HOME CARE INSTRUCTIONS   Schedule regular health, dental, and eye exams.  Stay current with your immunizations.   Do not use any tobacco products including cigarettes, chewing tobacco, or electronic cigarettes.  If you are pregnant, do not drink alcohol.  If you are breastfeeding, limit how much and how often you drink alcohol.  Limit alcohol intake to no more than 1 drink per day for nonpregnant women. One drink equals 12 ounces of beer, 5 ounces of wine, or 1 ounces of hard liquor.  Do  not use street drugs.  Do not share needles.  Ask your health care provider for help if you need support or information about quitting drugs.  Tell your health care provider if you often feel depressed.  Tell your health care provider if you have ever been abused or do not feel safe at home. Document Released: 11/03/2010 Document Revised: 09/04/2013 Document Reviewed: 03/22/2013 [facility] Patient Information 2015 [place], [place]. This information is not intended to replace advice given to you by your health care provider. Make sure you discuss any questions you have with your health care provider.

## 2015-05-24 LAB — URINALYSIS W MICROSCOPIC + REFLEX CULTURE
BILIRUBIN URINE: NEGATIVE
Bacteria, UA: NONE SEEN [HPF]
CASTS: NONE SEEN [LPF]
Crystals: NONE SEEN [HPF]
GLUCOSE, UA: NEGATIVE
Hgb urine dipstick: NEGATIVE
KETONES UR: NEGATIVE
LEUKOCYTES UA: NEGATIVE
NITRITE: NEGATIVE
PROTEIN: NEGATIVE
RBC / HPF: NONE SEEN RBC/HPF (ref <=2)
Specific Gravity, Urine: 1.002 (ref 1.001–1.035)
Squamous Epithelial / LPF: NONE SEEN [HPF] (ref <=5)
WBC, UA: NONE SEEN WBC/HPF (ref <=5)
Yeast: NONE SEEN [HPF]
pH: 6.5 (ref 5.0–8.0)

## 2015-05-27 LAB — CYTOLOGY - PAP

## 2015-06-06 ENCOUNTER — Telehealth: Payer: Self-pay | Admitting: Gynecology

## 2015-06-06 NOTE — Telephone Encounter (Signed)
06/06/15-I talked to pt to let her know that her [facility] puts the cost of the sonohysterogram & bx towards her $2700.00 deductible of which only $14.24 has been met. She would be responsible for the allowable of $875.19 for the test and an additional $251.43 if bx done. She will talk with her husband and let me know how much they will pay up front. Per Sherri @ [phone].wl

## 2015-06-07 ENCOUNTER — Other Ambulatory Visit: Payer: Self-pay | Admitting: Gynecology

## 2015-06-07 DIAGNOSIS — N939 Abnormal uterine and vaginal bleeding, unspecified: Secondary | ICD-10-CM

## 2015-06-12 ENCOUNTER — Other Ambulatory Visit: Payer: Self-pay | Admitting: Gynecology

## 2015-06-12 ENCOUNTER — Ambulatory Visit (INDEPENDENT_AMBULATORY_CARE_PROVIDER_SITE_OTHER): Payer: BLUE CROSS/BLUE SHIELD

## 2015-06-12 ENCOUNTER — Ambulatory Visit (INDEPENDENT_AMBULATORY_CARE_PROVIDER_SITE_OTHER): Payer: BLUE CROSS/BLUE SHIELD | Admitting: Gynecology

## 2015-06-12 ENCOUNTER — Encounter: Payer: Self-pay | Admitting: Gynecology

## 2015-06-12 VITALS — BP 116/74

## 2015-06-12 DIAGNOSIS — R102 Pelvic and perineal pain: Secondary | ICD-10-CM | POA: Diagnosis not present

## 2015-06-12 DIAGNOSIS — D251 Intramural leiomyoma of uterus: Secondary | ICD-10-CM

## 2015-06-12 DIAGNOSIS — N939 Abnormal uterine and vaginal bleeding, unspecified: Secondary | ICD-10-CM

## 2015-06-12 DIAGNOSIS — N921 Excessive and frequent menstruation with irregular cycle: Secondary | ICD-10-CM

## 2015-06-12 DIAGNOSIS — N946 Dysmenorrhea, unspecified: Secondary | ICD-10-CM

## 2015-06-12 DIAGNOSIS — N926 Irregular menstruation, unspecified: Secondary | ICD-10-CM

## 2015-06-12 NOTE — Progress Notes (Signed)
Carrie Morrison [birth year] [id number]        35 y.o.  G1P0101 presents for sonohysterogram due to breakthrough bleeding and worsening dysmenorrhea. Recent blood work normal to include normal TSH hemoglobin 14 normal comprehensive metabolic panel  Past medical history,surgical history, problem list, medications, allergies, family history and social history were all reviewed and documented in the EPIC chart.  Directed ROS with pertinent positives and negatives documented in the history of present illness/assessment and plan.  Exam: [place] assistant Filed Vitals:   06/12/15 1045  BP: 116/74   General appearance:  Normal Abdomen soft nontender without masses guarding rebound Pelvic external BUS vagina normal. Cervix normal. Uterus normal size midline mobile nontender. Adnexa without masses or tenderness.  Ultrasound shows uterus normal size and echotexture. 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  Endometrial echo 3.8 mm. Right and left ovaries with physiologic changes. Cul-de-sac negative.  Sonohysterogram performed, sterile technique, easy catheter introduction, good distention with no abnormalities. Endometrial sample taken. Patient tolerated well.  Assessment/Plan:  35 y.o. G1P0101 with history of breakthrough bleeding and dysmenorrhea. Ultrasound is normal. Patient will follow up for biopsy results. Options for management reviewed to include expectant, and hormonal manipulation such as birth control pills or Mirena IUD, laparoscopy/rule out endometriosis, hysterectomy all reviewed. At this point the patient wishes no intervention assuming biopsy is normal. Would prefer just to monitor for now. Will follow up if continues/worsens. Otherwise follow up when she is due for her annual exam.    Anastasio Auerbach MD, 10:58 AM 06/12/2015

## 2015-06-12 NOTE — Patient Instructions (Signed)
OFFICE WILL CALL YOU WITH BIOPSY RESULTS.

## 2015-08-01 ENCOUNTER — Encounter: Payer: Self-pay | Admitting: Family Medicine

## 2015-08-01 ENCOUNTER — Ambulatory Visit (INDEPENDENT_AMBULATORY_CARE_PROVIDER_SITE_OTHER): Payer: BLUE CROSS/BLUE SHIELD | Admitting: Family Medicine

## 2015-08-01 VITALS — BP 109/79 | HR 71 | Temp 98.2°F | Ht 62.0 in | Wt 136.8 lb

## 2015-08-01 DIAGNOSIS — Z23 Encounter for immunization: Secondary | ICD-10-CM | POA: Diagnosis not present

## 2015-08-01 DIAGNOSIS — S80211A Abrasion, right knee, initial encounter: Secondary | ICD-10-CM | POA: Diagnosis not present

## 2015-08-01 NOTE — Patient Instructions (Signed)
Td Vaccine (Tetanus and Diphtheria): What You Need to Know  1. Why get vaccinated?  Tetanus  and diphtheria are very serious diseases. They are rare in the [place] today, but people who do become infected often have severe complications. Td vaccine is used to protect adolescents and adults from both of these diseases.  Both tetanus and diphtheria are infections caused by bacteria. Diphtheria spreads from person to person through coughing or sneezing. Tetanus-causing bacteria enter the body through cuts, scratches, or wounds.  TETANUS (Lockjaw) causes painful muscle tightening and stiffness, usually all over the body.  · It can lead to tightening of muscles in the head and neck so you can't open your mouth, swallow, or sometimes even breathe. Tetanus kills about 1 out of every 10 people who are infected even after receiving the best medical care.  DIPHTHERIA can cause a thick coating to form in the back of the throat.  · It can lead to breathing problems, paralysis, heart failure, and death.  Before vaccines, as many as 200,000 cases of diphtheria and hundreds of cases of tetanus were reported in the [place] each year. Since vaccination began, reports of cases for both diseases have dropped by about 99%.  2. Td vaccine  Td vaccine can protect adolescents and adults from tetanus and diphtheria. Td is usually given as a booster dose every 10 years but it can also be given earlier after a severe and dirty wound or burn.  Another vaccine, called Tdap, which protects against pertussis in addition to tetanus and diphtheria, is sometimes recommended instead of Td vaccine.  Your doctor or the person giving you the vaccine can give you more information.  Td may safely be given at the same time as other vaccines.  3. Some people should not get this vaccine  · A person who has ever had a life-threatening allergic reaction after a previous dose of any tetanus or diphtheria containing vaccine, OR has a severe allergy  to any part of this vaccine, should not get Td vaccine. Tell the person giving the vaccine about any severe allergies.  · Talk to your doctor if you:    have seizures or another nervous system problem,    had severe pain or swelling after any vaccine containing diphtheria or tetanus,    ever had a condition called Guillain Barre Syndrome (GBS),    aren't feeling well on the day the shot is scheduled.  4. Risks of a vaccine reaction  With any medicine, including vaccines, there is a chance of side effects. These are usually mild and go away on their own. Serious reactions are also possible but are rare.  Most people who get Td vaccine do not have any problems with it.  Mild Problems  following Td vaccine:  (Did not interfere with activities)  · Pain where the shot was given (about 8 people in 10)  · Redness or swelling where the shot was given (about 1 person in 4)  · Mild fever (rare)  · Headache (about 1 person in 4)  · Tiredness (about 1 person in 4)  Moderate Problems following Td vaccine:  (Interfered with activities, but did not require medical attention)  · Fever over 102°F (rare)  Severe Problems  following Td vaccine:  (Unable to perform usual activities; required medical attention)  · Swelling, severe pain, bleeding and/or redness in the arm where the shot was given (rare).  Problems that could happen after any vaccine:  · People sometimes   faint after a medical procedure, including vaccination. Sitting or lying down for about 15 minutes can help prevent fainting, and injuries caused by a fall. Tell your doctor if you feel dizzy, or have vision changes or ringing in the ears.  · Some people get severe pain in the shoulder and have difficulty moving the arm where a shot was given. This happens very rarely.  · Any medication can cause a severe allergic reaction. Such reactions from a vaccine are very rare, estimated at fewer than 1 in a million doses, and would happen within a few minutes to a few hours after  the vaccination.  As with any medicine, there is a very remote chance of a vaccine causing a serious injury or death.  The safety of vaccines is always being monitored. For more information, visit: www.cdc.gov/vaccinesafety/  5. What if there is a serious reaction?  What should I look for?  · Look for anything that concerns you, such as signs of a severe allergic reaction, very high fever, or unusual behavior.  Signs of a severe allergic reaction can include hives, swelling of the face and throat, difficulty breathing, a fast heartbeat, dizziness, and weakness. These would usually start a few minutes to a few hours after the vaccination.  What should I do?  · If you think it is a severe allergic reaction or other emergency that can't wait, call 9-1-1 or get the person to the nearest hospital. Otherwise, call your doctor.  · Afterward, the reaction should be reported to the Vaccine Adverse Event Reporting System (VAERS). Your doctor might file this report, or you can do it yourself through the VAERS web site at www.vaers.hhs.gov, or by calling 1-800-822-7967.  VAERS does not give medical advice.  6. The National Vaccine Injury Compensation Program  The National Vaccine Injury Compensation Program (VICP) is a federal program that was created to compensate people who may have been injured by certain vaccines.  Persons who believe they may have been injured by a vaccine can learn about the program and about filing a claim by calling 1-800-338-2382 or visiting the VICP website at www.hrsa.gov/vaccinecompensation. There is a time limit to file a claim for compensation.  7. How can I learn more?  · Ask your doctor. He or she can give you the vaccine package insert or suggest other sources of information.  · Call your local or state health department.  · Contact the Centers for Disease Control and Prevention (CDC):    Call 1-800-232-4636 (1-800-CDC-INFO)    Visit CDC's website at www.cdc.gov/vaccines  CDC Td Vaccine VIS  (06/27/13)     This information is not intended to replace advice given to you by your health care provider. Make sure you discuss any questions you have with your health care provider.     Document Released: 02/15/2006 Document Revised: 05/11/2014 Document Reviewed: 08/02/2013  Elsevier Interactive Patient Education ©2016 [employer]

## 2015-08-01 NOTE — Assessment & Plan Note (Signed)
tdap given No infection  rto prn

## 2015-08-01 NOTE — Progress Notes (Signed)
Pre visit review using our clinic review tool, if applicable. No additional management support is needed unless otherwise documented below in the visit note. 

## 2015-08-01 NOTE — Progress Notes (Signed)
Patient ID: Carrie Morrison, female    DOB: [birth year]  Age: 35 y.o. MRN: [id number]    Subjective:  Subjective HPI Carrie Morrison presents for tdap.  She has an abrasion on her knee and is requesting a tdap.    Review of Systems  Constitutional: Negative for diaphoresis, appetite change, fatigue and unexpected weight change.  Eyes: Negative for pain, redness and visual disturbance.  Respiratory: Negative for cough, chest tightness, shortness of breath and wheezing.   Cardiovascular: Negative for chest pain, palpitations and leg swelling.  Endocrine: Negative for cold intolerance, heat intolerance, polydipsia, polyphagia and polyuria.  Genitourinary: Negative for dysuria, frequency and difficulty urinating.  Neurological: Negative for dizziness, light-headedness, numbness and headaches.    History Past Medical History  Diagnosis Date  . GERD (gastroesophageal reflux disease)   . Pulmonary nodule, right   . Hyperlipemia     She has past surgical history that includes Cesarean section (11/04/09); Mouth surgery (1995); and Rhinoplasty (2009).   Her family history includes Asthma in her brother; Colon cancer in her maternal grandfather, maternal grandmother, and paternal grandmother; Diabetes in her paternal grandmother; Heart attack in her paternal grandfather; Heart attack (age of onset: 11) in her father; Heart attack (age of onset: 16) in her mother; Heart disease in her father and mother; Hyperlipidemia in her father; Hypertension in her father; Stroke in her paternal grandmother.She reports that she quit smoking about 6 years ago. She has never used smokeless tobacco. She reports that she drinks about 4.2 oz of alcohol per week. She reports that she does not use illicit drugs.  Current Outpatient Prescriptions on File Prior to Visit  Medication Sig Dispense Refill  . EPINEPHrine 0.3 mg/0.3 mL IJ SOAJ injection Inject 0.3 mg into the muscle as needed. Reported on 05/23/2015     No  current facility-administered medications on file prior to visit.     Objective:  Objective Physical Exam  Constitutional: She is oriented to person, place, and time. She appears well-developed and well-nourished.  HENT:  Head: Normocephalic and atraumatic.  Eyes: Conjunctivae and EOM are normal.  Neck: Normal range of motion. Neck supple. No JVD present. Carotid bruit is not present. No thyromegaly present.  Cardiovascular: Normal rate, regular rhythm and normal heart sounds.   No murmur heard. Pulmonary/Chest: Effort normal and breath sounds normal. No respiratory distress. She has no wheezes. She has no rales. She exhibits no tenderness.  Musculoskeletal: She exhibits no edema.  Neurological: She is alert and oriented to person, place, and time.  Skin:     Psychiatric: She has a normal mood and affect.  Nursing note and vitals reviewed.  BP 109/79 mmHg  Pulse 71  Temp(Src) 98.2 F (36.8 C) (Oral)  Ht 5\' 2"  (1.575 m)  Wt 136 lb 12.8 oz (62.052 kg)  BMI 25.01 kg/m2  SpO2 100%  LMP 08/01/2015 Wt Readings from Last 3 Encounters:  08/01/15 136 lb 12.8 oz (62.052 kg)  05/23/15 141 lb (63.957 kg)  01/24/15 136 lb (61.689 kg)     Lab Results  Component Value Date   WBC 6.5 05/23/2015   HGB 14.2 05/23/2015   HCT 42.9 05/23/2015   PLT 375 05/23/2015   GLUCOSE 82 05/23/2015   CHOL 182 05/23/2015   TRIG 81 05/23/2015   HDL 76 05/23/2015   LDLCALC 90 05/23/2015   ALT 14 05/23/2015   AST 19 05/23/2015   NA 137 05/23/2015   K 4.2 05/23/2015   CL 102 05/23/2015   CREATININE  0.76 05/23/2015   BUN 9 05/23/2015   CO2 27 05/23/2015   TSH 1.744 05/23/2015   HGBA1C 5.1 10/29/2011    No results found.   Assessment & Plan:  Plan I am having Ms. Linnen maintain her EPINEPHrine.  No orders of the defined types were placed in this encounter.    Problem List Items Addressed This Visit      Unprioritized   Abrasion of knee, right - Primary    tdap given No infection   rto prn       Other Visit Diagnoses    Need for diphtheria-tetanus-pertussis (Tdap) vaccine, adult/adolescent        Relevant Orders    Tdap vaccine greater than or equal to 7yo IM (Completed)       Follow-up: Return if symptoms worsen or fail to improve.  Ann Held, DO

## 2015-11-01 ENCOUNTER — Encounter: Payer: Self-pay | Admitting: Family Medicine

## 2015-11-01 ENCOUNTER — Ambulatory Visit (INDEPENDENT_AMBULATORY_CARE_PROVIDER_SITE_OTHER): Payer: BLUE CROSS/BLUE SHIELD | Admitting: Family Medicine

## 2015-11-01 VITALS — BP 106/68 | HR 82 | Temp 97.8°F | Ht 62.0 in | Wt 133.4 lb

## 2015-11-01 DIAGNOSIS — R2242 Localized swelling, mass and lump, left lower limb: Secondary | ICD-10-CM | POA: Diagnosis not present

## 2015-11-01 NOTE — Patient Instructions (Signed)
We will schedule an ultrasound of the L upper thigh and call you with the appointment

## 2015-11-01 NOTE — Progress Notes (Signed)
Subjective:    Patient ID: Carrie Morrison, female    DOB: 09-09-1980, 35 y.o.   MRN: [id number]  Chief Complaint  Patient presents with  . Mass    on the left thigh x's 6 mos    HPI Patient is in today for small nodule on L thigh.  Tender to touch.  Past Medical History  Diagnosis Date  . GERD (gastroesophageal reflux disease)   . Pulmonary nodule, right   . Hyperlipemia     Past Surgical History  Procedure Laterality Date  . Cesarean section  11/04/09  . Mouth surgery  1995  . Rhinoplasty  2009    Family History  Problem Relation Age of Onset  . Heart disease Mother   . Heart disease Father   . Hypertension Father   . Colon cancer Paternal Grandmother   . Diabetes Paternal Grandmother   . Hyperlipidemia Father   . Heart attack Mother 65  . Heart attack Father 24  . Colon cancer Maternal Grandfather   . Colon cancer Maternal Grandmother   . Stroke Paternal Grandmother   . Asthma Brother   . Heart attack Paternal Grandfather     Social History   Social History  . Marital Status: Married    Spouse Name: N/A  . Number of Children: N/A  . Years of Education: N/A   Occupational History  . Not on file.   Social History Main Topics  . Smoking status: Former Smoker    Quit date: 12/23/2008  . Smokeless tobacco: Never Used  . Alcohol Use: 4.2 oz/week    7 Glasses of wine per week  . Drug Use: No  . Sexual Activity:    Partners: Male    Birth Control/ Protection: None     Comment: 1st intercourse 35 yo-Fewer than 5 partners   Other Topics Concern  . Not on file   Social History Narrative    Outpatient Prescriptions Prior to Visit  Medication Sig Dispense Refill  . EPINEPHrine 0.3 mg/0.3 mL IJ SOAJ injection Inject 0.3 mg into the muscle as needed. Reported on 05/23/2015     No facility-administered medications prior to visit.    Allergies  Allergen Reactions  . Fluconazole In Dextrose Other (See Comments)    Tongue /throat swelled up  . Latex  Anaphylaxis, Hives, Shortness Of Breath and Itching    Tongue and throat swelling    Review of Systems  Constitutional: Negative for fever and malaise/fatigue.  HENT: Negative for congestion.   Eyes: Negative for blurred vision.  Respiratory: Negative for shortness of breath.   Cardiovascular: Negative for chest pain, palpitations and leg swelling.  Gastrointestinal: Negative for nausea, abdominal pain and blood in stool.  Genitourinary: Negative for dysuria and frequency.  Musculoskeletal: Negative for falls.  Skin: Negative for rash.  Neurological: Negative for dizziness, loss of consciousness and headaches.  Endo/Heme/Allergies: Negative for environmental allergies.  Psychiatric/Behavioral: Negative for depression. The patient is not nervous/anxious.        Objective:    Physical Exam  Constitutional: She appears well-developed and well-nourished. No distress.  Musculoskeletal: Normal range of motion. She exhibits tenderness.       Legs: Skin:     Nursing note and vitals reviewed.   BP 106/68 mmHg  Pulse 82  Temp(Src) 97.8 F (36.6 C) (Oral)  Ht 5\' 2"  (1.575 m)  Wt 133 lb 6.4 oz (60.51 kg)  BMI 24.39 kg/m2  SpO2 99%  LMP 10/28/2015 Wt Readings from Last 3  Encounters:  11/01/15 133 lb 6.4 oz (60.51 kg)  08/01/15 136 lb 12.8 oz (62.052 kg)  05/23/15 141 lb (63.957 kg)     Lab Results  Component Value Date   WBC 6.5 05/23/2015   HGB 14.2 05/23/2015   HCT 42.9 05/23/2015   PLT 375 05/23/2015   GLUCOSE 82 05/23/2015   CHOL 182 05/23/2015   TRIG 81 05/23/2015   HDL 76 05/23/2015   LDLCALC 90 05/23/2015   ALT 14 05/23/2015   AST 19 05/23/2015   NA 137 05/23/2015   K 4.2 05/23/2015   CL 102 05/23/2015   CREATININE 0.76 05/23/2015   BUN 9 05/23/2015   CO2 27 05/23/2015   TSH 1.744 05/23/2015   HGBA1C 5.1 10/29/2011    Lab Results  Component Value Date   TSH 1.744 05/23/2015   Lab Results  Component Value Date   WBC 6.5 05/23/2015   HGB 14.2  05/23/2015   HCT 42.9 05/23/2015   MCV 96.2 05/23/2015   PLT 375 05/23/2015   Lab Results  Component Value Date   NA 137 05/23/2015   K 4.2 05/23/2015   CO2 27 05/23/2015   GLUCOSE 82 05/23/2015   BUN 9 05/23/2015   CREATININE 0.76 05/23/2015   BILITOT 0.8 05/23/2015   ALKPHOS 58 05/23/2015   AST 19 05/23/2015   ALT 14 05/23/2015   PROT 6.9 05/23/2015   ALBUMIN 4.2 05/23/2015   CALCIUM 9.7 05/23/2015   GFR 78.92 08/19/2012   Lab Results  Component Value Date   CHOL 182 05/23/2015   Lab Results  Component Value Date   HDL 76 05/23/2015   Lab Results  Component Value Date   LDLCALC 90 05/23/2015   Lab Results  Component Value Date   TRIG 81 05/23/2015   Lab Results  Component Value Date   CHOLHDL 2.4 05/23/2015   Lab Results  Component Value Date   HGBA1C 5.1 10/29/2011       Assessment & Plan:   Problem List Items Addressed This Visit    None    Visit Diagnoses    Mass of left thigh    -  Primary    Relevant Orders    [place] Misc Soft Tissue       I am having Ms. Mccauslin maintain her EPINEPHrine.  No orders of the defined types were placed in this encounter.     Ann Held, DO

## 2015-11-04 ENCOUNTER — Ambulatory Visit (HOSPITAL_BASED_OUTPATIENT_CLINIC_OR_DEPARTMENT_OTHER)
Admission: RE | Admit: 2015-11-04 | Discharge: 2015-11-04 | Disposition: A | Discharge status: Home / Self Care | Payer: BLUE CROSS/BLUE SHIELD | Source: Ambulatory Visit | Attending: Family Medicine | Admitting: Family Medicine

## 2015-11-04 DIAGNOSIS — R2242 Localized swelling, mass and lump, left lower limb: Secondary | ICD-10-CM | POA: Diagnosis present

## 2015-11-11 ENCOUNTER — Other Ambulatory Visit: Payer: Self-pay

## 2015-11-11 DIAGNOSIS — R229 Localized swelling, mass and lump, unspecified: Principal | ICD-10-CM

## 2015-11-11 DIAGNOSIS — IMO0002 Reserved for concepts with insufficient information to code with codable children: Secondary | ICD-10-CM

## 2016-05-25 ENCOUNTER — Encounter: Payer: BLUE CROSS/BLUE SHIELD | Admitting: Gynecology

## 2016-06-29 ENCOUNTER — Encounter: Payer: BLUE CROSS/BLUE SHIELD | Admitting: Gynecology

## 2017-07-26 ENCOUNTER — Telehealth: Payer: Self-pay

## 2017-07-26 NOTE — Telephone Encounter (Signed)
Okay 

## 2017-07-26 NOTE — Telephone Encounter (Signed)
Please call to schedule New patient appointment with Dr. Juleen China

## 2017-07-26 NOTE — Telephone Encounter (Signed)
Ok with me 

## 2017-07-26 NOTE — Telephone Encounter (Signed)
Copied from [place] [id number]. Topic: General - Other >> Jul 26, 2017 10:10 AM Carrie Morrison wrote:  Pt would like to transfer to Dr Juleen China at [facility] .She said she moved

## 2017-07-26 NOTE — Telephone Encounter (Signed)
Please advise 

## 2017-07-28 NOTE — Telephone Encounter (Signed)
Appt scheduled w/ Dr. Juleen China 08/03/2017.

## 2017-08-02 NOTE — Progress Notes (Signed)
Carrie Morrison is a 37 y.o. female is here TO ESTABLISH CARE.  History of Present Illness:   Lonell Grandchild, CMA acting as scribe for Dr. Briscoe Deutscher.   HPI: Patient in office to establish care. Patient has knot on left leg. She had been evaluated in the past and was told to watch it. She has noticed some changes. She feels like the edges have changed and it has gotten larger.   She also has noticed floaters in her right eye. She feels like they are there all the time. She has not had eye exam.   Health Maintenance Due  Topic Date Due  . HIV Screening  03/03/1995   Depression screen [facility] 2/9 08/03/2017 08/01/2015 08/16/2012  Decreased Interest 0 0 0  Down, Depressed, Hopeless 0 0 0  PHQ - 2 Score 0 0 0  Altered sleeping 0 - -  Tired, decreased energy 0 - -  Change in appetite 0 - -  Feeling bad or failure about yourself  0 - -  Trouble concentrating 0 - -  Moving slowly or fidgety/restless 0 - -  Suicidal thoughts 0 - -  PHQ-9 Score 0 - -  Difficult doing work/chores Not difficult at all - -   PMHx, SurgHx, SocialHx, FamHx, Medications, and Allergies were reviewed in the Visit Navigator and updated as appropriate.   Patient Active Problem List   Diagnosis Date Noted  . Mass of left thigh 08/04/2017  . Right lower lobe pulmonary nodule 08/04/2017  . Vision changes 08/04/2017  . Nonallopathic lesion of thoracic region 09/27/2014  . Nonallopathic lesion of cervical region 09/27/2014  . Nonallopathic lesion of lumbosacral region 09/27/2014  . IT band syndrome 09/06/2014  . Stress fracture of fibula 07/26/2014  . Oral aphthous ulcer 09/29/2013   Social History   Tobacco Use  . Smoking status: Former Smoker    Last attempt to quit: 12/23/2008    Years since quitting: 8.6  . Smokeless tobacco: Never Used  Substance Use Topics  . Alcohol use: Yes    Alcohol/week: 4.2 oz    Types: 7 Glasses of wine per week  . Drug use: No   Current Medications and Allergies:   .   EPINEPHrine 0.3 mg/0.3 mL IJ SOAJ injection, Inject 0.3 mg into the muscle as needed. Reported on 05/23/2015, Disp: , Rfl:   Allergies  Allergen Reactions  . Fluconazole In Dextrose Other (See Comments)    Tongue /throat swelled up  . Latex Anaphylaxis, Hives, Shortness Of Breath and Itching    Tongue and throat swelling   Review of Systems   Pertinent items are noted in the HPI. Otherwise, ROS is negative.  Vitals:   Vitals:   08/03/17 1425  BP: 120/64  Pulse: 77  Temp: 98.2 F (36.8 C)  TempSrc: Oral  SpO2: 99%  Weight: 136 lb 9.6 oz (62 kg)  Height: _0  (1.575 m)     Body mass index is 24.98 kg/m.   Physical Exam:   Physical Exam  Constitutional: She is oriented to person, place, and time. She appears well-developed and well-nourished. No distress.  HENT:  Head: Normocephalic and atraumatic.  Right Ear: External ear normal.  Left Ear: External ear normal.  Nose: Nose normal.  Mouth/Throat: Oropharynx is clear and moist.  Eyes: Pupils are equal, round, and reactive to light. Conjunctivae and EOM are normal.  Neck: Normal range of motion. Neck supple.  Cardiovascular: Normal rate, regular rhythm, normal heart sounds and intact distal  pulses.  Pulmonary/Chest: Effort normal and breath sounds normal.  Abdominal: Soft. Bowel sounds are normal.  Musculoskeletal: Normal range of motion.  Neurological: She is alert and oriented to person, place, and time.  Skin: Skin is warm.  Dime-sized tender nodule on anterior left thigh with blue tint to center.  Psychiatric: She has a normal mood and affect. Her behavior is normal.  Nursing note and vitals reviewed.   Results for orders placed or performed in visit on 05/23/15  CBC with Differential/Platelet  Result Value Ref Range   WBC 6.5 4.0 - 10.5 K/uL   RBC 4.46 3.87 - 5.11 MIL/uL   Hemoglobin 14.2 12.0 - 15.0 g/dL   HCT 42.9 36.0 - 46.0 %   MCV 96.2 78.0 - 100.0 fL   MCH 31.8 26.0 - 34.0 pg   MCHC 33.1 30.0 - 36.0  g/dL   RDW 12.4 11.5 - 15.5 %   Platelets 375 150 - 400 K/uL   MPV 10.3 8.6 - 12.4 fL   Neutrophils Relative % 55 43 - 77 %   Neutro Abs 3.6 1.7 - 7.7 K/uL   Lymphocytes Relative 32 12 - 46 %   Lymphs Abs 2.1 0.7 - 4.0 K/uL   Monocytes Relative 7 3 - 12 %   Monocytes Absolute 0.5 0.1 - 1.0 K/uL   Eosinophils Relative 5 0 - 5 %   Eosinophils Absolute 0.3 0.0 - 0.7 K/uL   Basophils Relative 1 0 - 1 %   Basophils Absolute 0.1 0.0 - 0.1 K/uL   Smear Review Criteria for review not met   Comprehensive metabolic panel  Result Value Ref Range   Sodium 137 135 - 146 mmol/L   Potassium 4.2 3.5 - 5.3 mmol/L   Chloride 102 98 - 110 mmol/L   CO2 27 20 - 31 mmol/L   Glucose, Bld 82 65 - 99 mg/dL   BUN 9 7 - 25 mg/dL   Creat 0.76 0.50 - 1.10 mg/dL   Total Bilirubin 0.8 0.2 - 1.2 mg/dL   Alkaline Phosphatase 58 33 - 115 U/L   AST 19 10 - 30 U/L   ALT 14 6 - 29 U/L   Total Protein 6.9 6.1 - 8.1 g/dL   Albumin 4.2 3.6 - 5.1 g/dL   Calcium 9.7 8.6 - 10.2 mg/dL  Lipid panel  Result Value Ref Range   Cholesterol 182 125 - 200 mg/dL   Triglycerides 81 <150 mg/dL   HDL 76 >=46 mg/dL   Total CHOL/HDL Ratio 2.4 <=5.0 Ratio   VLDL 16 <30 mg/dL   LDL Cholesterol 90 <130 mg/dL  Urinalysis w microscopic + reflex cultur  Result Value Ref Range   Color, Urine YELLOW YELLOW   APPearance CLEAR CLEAR   Specific Gravity, Urine 1.002 1.001 - 1.035   pH 6.5 5.0 - 8.0   Glucose, UA NEGATIVE NEGATIVE   Bilirubin Urine NEGATIVE NEGATIVE   Ketones, ur NEGATIVE NEGATIVE   Hgb urine dipstick NEGATIVE NEGATIVE   Protein, ur NEGATIVE NEGATIVE   Nitrite NEGATIVE NEGATIVE   Leukocytes, UA NEGATIVE NEGATIVE   WBC, UA NONE SEEN <=5 WBC/HPF   RBC / HPF NONE SEEN <=2 RBC/HPF   Squamous Epithelial / LPF NONE SEEN <=5 HPF   Bacteria, UA NONE SEEN NONE SEEN HPF   Crystals NONE SEEN NONE SEEN HPF   Casts NONE SEEN NONE SEEN LPF   Yeast NONE SEEN NONE SEEN HPF  TSH  Result Value Ref Range   TSH 1.744  0.350 -  4.500 uIU/mL  Cytology - PAP  Result Value Ref Range   CYTOLOGY - PAP PAP RESULT    Assessment and Plan:   Diagnoses and all orders for this visit:  Mass of left thigh Comments: Previous ultrasound in 2017 with concern for possible malignancy.  Patient opted to hold on treatment at that time. Orders: -     CBC with Differential/Platelet -     Comprehensive metabolic panel -     Ambulatory referral to Dermatology  Vision changes Comments: Over one year now.  She does not have an ophthalmologist so we will send her to a local group. Orders: -     Ambulatory referral to Ophthalmology  Right lower lobe pulmonary nodule Comments: History.  This is been followed with CTs and chest x-rays.  She says that they were stable so she was told she did not need further follow-up.   . Reviewed expectations re: course of current medical issues. . Discussed self-management of symptoms. . Outlined signs and symptoms indicating need for more acute intervention. . Patient verbalized understanding and all questions were answered. Marland Kitchen Health Maintenance issues including appropriate healthy diet, exercise, and smoking avoidance were discussed with patient. . See orders for this visit as documented in the electronic medical record. . Patient received an After Visit Summary.  Briscoe Deutscher, DO [place] 08/04/2017  Future Appointments  Date Time Provider [place]  11/02/2017  9:00 AM Briscoe Deutscher, DO [facility]

## 2017-08-03 ENCOUNTER — Ambulatory Visit (INDEPENDENT_AMBULATORY_CARE_PROVIDER_SITE_OTHER): Payer: BLUE CROSS/BLUE SHIELD | Admitting: Family Medicine

## 2017-08-03 ENCOUNTER — Encounter: Payer: Self-pay | Admitting: Family Medicine

## 2017-08-03 VITALS — BP 120/64 | HR 77 | Temp 98.2°F | Ht 62.0 in | Wt 136.6 lb

## 2017-08-03 DIAGNOSIS — R2242 Localized swelling, mass and lump, left lower limb: Secondary | ICD-10-CM

## 2017-08-03 DIAGNOSIS — H539 Unspecified visual disturbance: Secondary | ICD-10-CM | POA: Diagnosis not present

## 2017-08-03 DIAGNOSIS — R911 Solitary pulmonary nodule: Secondary | ICD-10-CM | POA: Diagnosis not present

## 2017-08-04 ENCOUNTER — Encounter: Payer: Self-pay | Admitting: Family Medicine

## 2017-08-04 DIAGNOSIS — R2242 Localized swelling, mass and lump, left lower limb: Secondary | ICD-10-CM | POA: Insufficient documentation

## 2017-08-04 DIAGNOSIS — H539 Unspecified visual disturbance: Secondary | ICD-10-CM | POA: Insufficient documentation

## 2017-08-04 DIAGNOSIS — R911 Solitary pulmonary nodule: Secondary | ICD-10-CM | POA: Insufficient documentation

## 2017-08-04 LAB — COMPREHENSIVE METABOLIC PANEL
ALT: 14 U/L (ref 0–35)
AST: 20 U/L (ref 0–37)
Albumin: 4.4 g/dL (ref 3.5–5.2)
Alkaline Phosphatase: 52 U/L (ref 39–117)
BUN: 13 mg/dL (ref 6–23)
CO2: 27 mEq/L (ref 19–32)
Calcium: 9.8 mg/dL (ref 8.4–10.5)
Chloride: 102 mEq/L (ref 96–112)
Creatinine, Ser: 0.7 mg/dL (ref 0.40–1.20)
GFR: 99.84 mL/min (ref >60.00)
Glucose, Bld: 65 mg/dL — ABNORMAL LOW (ref 70–99)
Potassium: 4.4 mEq/L (ref 3.5–5.1)
Sodium: 139 mEq/L (ref 135–145)
Total Bilirubin: 1.6 mg/dL — ABNORMAL HIGH (ref 0.2–1.2)
Total Protein: 7.3 g/dL (ref 6.0–8.3)

## 2017-08-04 LAB — CBC WITH DIFFERENTIAL/PLATELET
Basophils Absolute: 0.1 10*3/uL (ref 0.0–0.1)
Basophils Relative: 0.9 % (ref 0.0–3.0)
Eosinophils Absolute: 0.2 10*3/uL (ref 0.0–0.7)
Eosinophils Relative: 3.5 % (ref 0.0–5.0)
HCT: 43.8 % (ref 36.0–46.0)
Hemoglobin: 14.8 g/dL (ref 12.0–15.0)
Lymphocytes Relative: 37 % (ref 12.0–46.0)
Lymphs Abs: 2.4 10*3/uL (ref 0.7–4.0)
MCHC: 33.8 g/dL (ref 30.0–36.0)
MCV: 97.6 fl (ref 78.0–100.0)
Monocytes Absolute: 0.4 10*3/uL (ref 0.1–1.0)
Monocytes Relative: 5.8 % (ref 3.0–12.0)
Neutro Abs: 3.4 10*3/uL (ref 1.4–7.7)
Neutrophils Relative %: 52.8 % (ref 43.0–77.0)
Platelets: 384 10*3/uL (ref 150.0–400.0)
RBC: 4.49 Mil/uL (ref 3.87–5.11)
RDW: 12.5 % (ref 11.5–15.5)
WBC: 6.5 10*3/uL (ref 4.0–10.5)

## 2017-10-05 ENCOUNTER — Ambulatory Visit: Payer: BLUE CROSS/BLUE SHIELD | Admitting: Family Medicine

## 2017-11-02 ENCOUNTER — Encounter: Payer: Self-pay | Admitting: Family Medicine

## 2017-11-02 ENCOUNTER — Ambulatory Visit (INDEPENDENT_AMBULATORY_CARE_PROVIDER_SITE_OTHER): Payer: BLUE CROSS/BLUE SHIELD | Admitting: Family Medicine

## 2017-11-02 VITALS — BP 100/66 | HR 75 | Temp 97.9°F | Ht 62.0 in | Wt 136.8 lb

## 2017-11-02 DIAGNOSIS — R2242 Localized swelling, mass and lump, left lower limb: Secondary | ICD-10-CM | POA: Diagnosis not present

## 2017-11-02 DIAGNOSIS — H43393 Other vitreous opacities, bilateral: Secondary | ICD-10-CM | POA: Diagnosis not present

## 2017-11-02 NOTE — Patient Instructions (Signed)
[facility] for [facility]12/2018 at 2:30.

## 2017-11-02 NOTE — Progress Notes (Signed)
Carrie Morrison is a 37 y.o. female is here for follow up.  History of Present Illness:   HPI: Doing well. Saw Ophtho and Derm. Ophtho Dx her with floaters. She felt that Derm did not take her seriously and wants a second opinion.   Health Maintenance Due  Topic Date Due  . HIV Screening  03/03/1995   Depression screen [facility] 2/9 11/02/2017 08/03/2017 08/01/2015  Decreased Interest 2 0 0  Down, Depressed, Hopeless 0 0 0  PHQ - 2 Score 2 0 0  Altered sleeping 0 0 -  Tired, decreased energy 1 0 -  Change in appetite 1 0 -  Feeling bad or failure about yourself  0 0 -  Trouble concentrating 0 0 -  Moving slowly or fidgety/restless 0 0 -  Suicidal thoughts 0 0 -  PHQ-9 Score 4 0 -  Difficult doing work/chores Not difficult at all Not difficult at all -   PMHx, SurgHx, SocialHx, FamHx, Medications, and Allergies were reviewed in the Visit Navigator and updated as appropriate.   Patient Active Problem List   Diagnosis Date Noted  . Mass of left thigh 08/04/2017  . Right lower lobe pulmonary nodule 08/04/2017  . Vision changes 08/04/2017  . Nonallopathic lesion of thoracic region 09/27/2014  . Nonallopathic lesion of cervical region 09/27/2014  . Nonallopathic lesion of lumbosacral region 09/27/2014  . IT band syndrome 09/06/2014  . Stress fracture of fibula 07/26/2014  . Oral aphthous ulcer 09/29/2013   Social History   Tobacco Use  . Smoking status: Former Smoker    Last attempt to quit: 12/23/2008    Years since quitting: 8.8  . Smokeless tobacco: Never Used  Substance Use Topics  . Alcohol use: Yes    Alcohol/week: 4.2 oz    Types: 7 Glasses of wine per week  . Drug use: No   Current Medications and Allergies:   Current Outpatient Medications:  .  EPINEPHrine 0.3 mg/0.3 mL IJ SOAJ injection, Inject 0.3 mg into the muscle as needed. Reported on 05/23/2015, Disp: , Rfl:    Allergies  Allergen Reactions  . Fluconazole In Dextrose Other (See Comments)    Tongue /throat  swelled up  . Latex Anaphylaxis, Hives, Shortness Of Breath and Itching    Tongue and throat swelling   Review of Systems   Pertinent items are noted in the HPI. Otherwise, ROS is negative.  Vitals:   Vitals:   11/02/17 0907  BP: 100/66  Pulse: 75  Temp: 97.9 F (36.6 C)  TempSrc: Oral  SpO2: 98%  Weight: 136 lb 12.8 oz (62.1 kg)  Height: 5\' 2"  (1.575 m)     Body mass index is 25.02 kg/m.  Physical Exam:   Physical Exam  Constitutional: She appears well-nourished.  HENT:  Head: Normocephalic and atraumatic.  Eyes: Pupils are equal, round, and reactive to light. EOM are normal.  Neck: Normal range of motion. Neck supple.  Cardiovascular: Normal rate, regular rhythm, normal heart sounds and intact distal pulses.  Pulmonary/Chest: Effort normal.  Abdominal: Soft.  Skin: Skin is warm.  Psychiatric: She has a normal mood and affect. Her behavior is normal.  Nursing note and vitals reviewed.   Assessment and Plan:   Jenille was seen today for follow-up.  Diagnoses and all orders for this visit:  Floaters in visual field, bilateral, followed by Ophtho  Mass of left thigh Comments: Referral to [facility].    . Reviewed expectations re: course of current medical issues. Marland Kitchen  Discussed self-management of symptoms. . Outlined signs and symptoms indicating need for more acute intervention. . Patient verbalized understanding and all questions were answered. Marland Kitchen Health Maintenance issues including appropriate healthy diet, exercise, and smoking avoidance were discussed with patient. . See orders for this visit as documented in the electronic medical record. . Patient received an After Visit Summary.  Briscoe Deutscher, DO [place], [facility] 11/02/2017  No future appointments.

## 2019-01-24 ENCOUNTER — Encounter: Payer: Self-pay | Admitting: Gynecology

## 2019-09-25 ENCOUNTER — Encounter: Payer: Self-pay | Admitting: Family Medicine

## 2019-09-25 ENCOUNTER — Telehealth (INDEPENDENT_AMBULATORY_CARE_PROVIDER_SITE_OTHER): Payer: Self-pay | Admitting: Family Medicine

## 2019-09-25 VITALS — Ht 62.0 in | Wt 136.0 lb

## 2019-09-25 DIAGNOSIS — J069 Acute upper respiratory infection, unspecified: Secondary | ICD-10-CM

## 2019-09-25 MED ORDER — AZITHROMYCIN 250 MG PO TABS: ORAL_TABLET | ORAL | 0 refills | Status: DC

## 2019-09-25 NOTE — Progress Notes (Signed)
Patient: Carrie Morrison MRN: [id number] DOB: May 31, 1980 PCP: Briscoe Deutscher, DO     I connected with Carrie Morrison on 09/25/19 at 8:38am by a video enabled telemedicine application and verified that I am speaking with the correct person using two identifiers.  Location patient: Home Location provider: [facility], Office Persons participating in this virtual visit: Carrie Morrison and Dr. Rogers Blocker   I discussed the limitations of evaluation and management by telemedicine and the availability of in person appointments. The patient expressed understanding and agreed to proceed.   Subjective:  Chief Complaint  Patient presents with  . Transitions Of Care  . URI    Starting over the weekend.    HPI: The patient is a 39 y.o. female who presents today for Transition of care. She states that she is not feeling well. She says that URI symptoms started over the weekend. 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 No longer needs follow up imaging. She has no other chronic medical issues. She is due for pap smear. She has a strong family history of heart disease. MI in father that killed him and mom had widow maker at 72 years.   She has complaints of a URI. She at first thought it was just allergies as she was working in a garden. She states she has had a fever to 102. She is feeling lousy. She is fatigued and feels worse at night and first thing in the AM. She has a lot of ear pain, cough. She has no pain in her sinuses. She does have a running nose and a sore throat from the drainage. Symptoms started last week, but progressed over the weekend. She has some pain in her chest from coughing so much. She has no shortness of breath or wheezing. Her cough is productive in the morning and it is green in nature. She is using dayquil. She has been around her nieces and nephews who were snotty nosed and it was shortly  after this that she got sick.    Review of Systems  Constitutional: Positive for fatigue. Negative for chills and fever.  HENT: Positive for congestion, postnasal drip, rhinorrhea and sore throat. Negative for sinus pressure and sinus pain.   Respiratory: Positive for cough. Negative for chest tightness, shortness of breath and wheezing.   Cardiovascular: Negative for chest pain and palpitations.  Gastrointestinal: Negative for abdominal pain, diarrhea, nausea and vomiting.  Neurological: Positive for headaches. Negative for dizziness and light-headedness.    Allergies Patient is allergic to fluconazole in dextrose and latex.  Past Medical History Patient  has a past medical history of GERD (gastroesophageal reflux disease), Hyperlipemia, and Pulmonary nodule, right.  Surgical History Patient  has a past surgical history that includes Cesarean section (11/04/09); Mouth surgery (1995); and Rhinoplasty (2009).  Family History Pateint's family history includes Asthma in her brother; Colon cancer in her maternal grandfather, maternal grandmother, and paternal grandmother; Diabetes in her paternal grandmother; Heart attack in her paternal grandfather; Heart attack (age of onset: 53) in her father; Heart attack (age of onset: 68) in her mother; Heart disease in her father and mother; Hyperlipidemia in her father; Hypertension in her father; Stroke in her paternal grandmother.  Social History Patient  reports that she quit smoking about 10 years ago. She has never used smokeless tobacco. She reports current alcohol use of about 7.0 standard drinks of alcohol per week.  She reports that she does not use drugs.    Objective: Vitals:   09/25/19 0821  Weight: 136 lb (61.7 kg)  Height: 5\' 2"  (1.575 m)    Body mass index is 24.87 kg/m.  Physical Exam Vitals reviewed.  Constitutional:      General: She is not in acute distress.    Appearance: Normal appearance. She is normal weight. She is  not ill-appearing.  HENT:     Head: Normocephalic and atraumatic.     Comments: No TTP when she palpates over her sinuses.     Nose: Congestion present.  Eyes:     Extraocular Movements: Extraocular movements intact.     Pupils: Pupils are equal, round, and reactive to light.  Pulmonary:     Effort: Pulmonary effort is normal.     Comments: Talking in full complete sentences with no work of breathing or retractions.  Neurological:     General: No focal deficit present.     Mental Status: She is alert and oriented to person, place, and time.  Psychiatric:        Mood and Affect: Mood normal.        Behavior: Behavior normal.        Assessment/plan: 1. Acute URI Viral vs. Bacterial. Sounds like she has what her nieces and nephews have. She is quarantining at home. Starting her on zpack since fever to 102, got better and is now getting worse. Does not wanted tested for covid, would rather stay at home. Discussed supportive therapy with fluids, cough medication, honey, flonase for post nasal drip and cool mist humidifier. She is to let me know if she feels like she is not getting better or symptoms are getting worse. Precautions given for ER.   Reviewed medical history, HM and chart. She is due for annual and pap smear and will come back for this.     Return if symptoms worsen or fail to improve, for pap/annual labs .  Records requested if needed. Time spent with patient: 25 minutes, of which >50% was spent in obtaining information about her symptoms, reviweing her previous labs, evaluations, and treatments, counseling her about her conditions (please see discussed topics above), and developing a plan to further investigate it; she had a number of questions which I addressed.    Orma Flaming, MD [place]  09/25/2019

## 2020-05-13 ENCOUNTER — Telehealth: Payer: Self-pay | Admitting: Family Medicine

## 2020-05-13 ENCOUNTER — Other Ambulatory Visit: Payer: Self-pay

## 2020-05-13 ENCOUNTER — Telehealth (INDEPENDENT_AMBULATORY_CARE_PROVIDER_SITE_OTHER): Payer: 59 | Admitting: Family Medicine

## 2020-05-13 ENCOUNTER — Encounter: Payer: Self-pay | Admitting: Family Medicine

## 2020-05-13 ENCOUNTER — Ambulatory Visit: Payer: Self-pay | Admitting: Family Medicine

## 2020-05-13 DIAGNOSIS — R0981 Nasal congestion: Secondary | ICD-10-CM

## 2020-05-13 NOTE — Telephone Encounter (Signed)
Patient has been scheduled

## 2020-05-13 NOTE — Telephone Encounter (Signed)
Please schedule her sometime next week for in office visit. She will be 14 days past covid and is fine. Can use acute if needed.  Thanks!   Dr. Rogers Blocker

## 2020-05-13 NOTE — Progress Notes (Deleted)
Patient: Carrie Morrison MRN: [id number] DOB: 25-Feb-1981 PCP: Orma Flaming, MD     Subjective:  Chief Complaint  Patient presents with  . Covid Exposure    Patient stated that her husband tested positive for covid for last Tuesday. She has not been tested. But she is assuming that she had covid as well since she has been around him.     HPI: The patient is a 40 y.o. female who presents today for multiple   Review of Systems  Constitutional: Negative for chills and fever.  HENT: Positive for congestion. Negative for sore throat.   Respiratory: Negative for cough, shortness of breath and wheezing.   Cardiovascular: Negative for chest pain and palpitations.  Gastrointestinal: Negative for abdominal pain, diarrhea, nausea and vomiting.  Musculoskeletal: Negative for back pain and neck pain.  Neurological: Negative for dizziness, light-headedness and headaches.  Psychiatric/Behavioral: Negative for agitation, hallucinations and suicidal ideas.    Allergies Patient is allergic to fluconazole in dextrose and latex.  Past Medical History Patient  has a past medical history of GERD (gastroesophageal reflux disease), Hyperlipemia, and Pulmonary nodule, right.  Surgical History Patient  has a past surgical history that includes Cesarean section (11/04/09); Mouth surgery (1995); and Rhinoplasty (2009).  Family History Pateint's family history includes Asthma in her brother; Colon cancer in her maternal grandfather, maternal grandmother, and paternal grandmother; Diabetes in her paternal grandmother; Heart attack in her paternal grandfather; Heart attack (age of onset: 62) in her father; Heart attack (age of onset: 20) in her mother; Heart disease in her father and mother; Hyperlipidemia in her father; Hypertension in her father; Stroke in her paternal grandmother.  Social History Patient  reports that she quit smoking about 11 years ago. She has never used smokeless tobacco. She reports  current alcohol use of about 7.0 standard drinks of alcohol per week. She reports that she does not use drugs.    Objective: There were no vitals filed for this visit.  There is no height or weight on file to calculate BMI.  Physical Exam     Assessment/plan:      No follow-ups on file.     @[facility] @ 05/13/2020

## 2020-05-13 NOTE — Progress Notes (Signed)
Patient: Carrie Morrison MRN: [id number] DOB: 1980-12-26 PCP: Orma Flaming, MD     I connected with Carrie Morrison on 05/13/20 at 1:05pm by a video enabled telemedicine application and verified that I am speaking with the correct person using two identifiers.  Location patient: Home Location provider: [facility], Office Persons participating in this virtual visit: Carrie Morrison and Dr. Rogers Blocker   I discussed the limitations of evaluation and management by telemedicine and the availability of in person appointments. The patient expressed understanding and agreed to proceed.   Subjective:  Chief Complaint  Patient presents with  . Covid Exposure    Patient stated that her husband tested positive for covid for last Tuesday. She has not been tested. But she is assuming that she had covid as well since she has been around him.     HPI: The patient is a 40 y.o. female who presents today for multiple issues. She likely had covid last week and her husband has covid right now. She was sick last week and is still having head congestion. She states she has had more sinus discomfort. She has used some decongestant. No nasal sprays.   She has also been having a lot of issues with her stomach/digestive issues.   She has painful periods and it's really bothering her.   She doesn't want to talk about these things until in office as her daughter is with her now.   Review of Systems  Constitutional: Positive for fatigue. Negative for chills and fever.  HENT: Positive for congestion and postnasal drip. Negative for ear pain, sinus pressure and sinus pain.   Eyes: Negative for visual disturbance.  Respiratory: Negative for cough and shortness of breath.   Cardiovascular: Negative for chest pain and palpitations.  Gastrointestinal: Negative for abdominal pain, diarrhea, nausea and vomiting.    Allergies Patient is allergic to fluconazole in dextrose and latex.  Past Medical History Patient  has  a past medical history of GERD (gastroesophageal reflux disease), Hyperlipemia, and Pulmonary nodule, right.  Surgical History Patient  has a past surgical history that includes Cesarean section (11/04/09); Mouth surgery (1995); and Rhinoplasty (2009).  Family History Pateint's family history includes Asthma in her brother; Colon cancer in her maternal grandfather, maternal grandmother, and paternal grandmother; Diabetes in her paternal grandmother; Heart attack in her paternal grandfather; Heart attack (age of onset: 9) in her father; Heart attack (age of onset: 55) in her mother; Heart disease in her father and mother; Hyperlipidemia in her father; Hypertension in her father; Stroke in her paternal grandmother.  Social History Patient  reports that she quit smoking about 11 years ago. She has never used smokeless tobacco. She reports current alcohol use of about 7.0 standard drinks of alcohol per week. She reports that she does not use drugs.    Objective: There were no vitals filed for this visit.  There is no height or weight on file to calculate BMI.  Physical Exam Vitals reviewed.  Constitutional:      General: She is not in acute distress.    Appearance: Normal appearance. She is normal weight. She is not ill-appearing.  HENT:     Head: Normocephalic and atraumatic.  Pulmonary:     Effort: Pulmonary effort is normal.  Neurological:     General: No focal deficit present.     Mental Status: She is alert and oriented to person, place, and time.  Psychiatric:        Mood and Affect: Mood normal.  Behavior: Behavior normal.        Assessment/plan: 1. Congestion of nasal sinus Secondary to covid. Doing much better. Advised conservative therapy with cool mist humidifier and flonase nasal spray at this point. If fever, teeth pain, worsening pressure we can add on antibiotic, but would try conservative therapy at this point.   Will see me next week after her quarantine is  up.      Return in about 1 week (around 05/20/2020) for in office visit. Orma Flaming, MD [place]  05/13/2020

## 2020-05-22 ENCOUNTER — Ambulatory Visit (INDEPENDENT_AMBULATORY_CARE_PROVIDER_SITE_OTHER): Payer: 59 | Admitting: Family Medicine

## 2020-05-22 ENCOUNTER — Encounter: Payer: Self-pay | Admitting: Family Medicine

## 2020-05-22 ENCOUNTER — Other Ambulatory Visit: Payer: Self-pay

## 2020-05-22 VITALS — BP 119/72 | HR 78 | Temp 98.1°F | Ht 62.0 in | Wt 146.6 lb

## 2020-05-22 DIAGNOSIS — N812 Incomplete uterovaginal prolapse: Secondary | ICD-10-CM

## 2020-05-22 DIAGNOSIS — R198 Other specified symptoms and signs involving the digestive system and abdomen: Secondary | ICD-10-CM

## 2020-05-22 DIAGNOSIS — R197 Diarrhea, unspecified: Secondary | ICD-10-CM | POA: Diagnosis not present

## 2020-05-22 LAB — COMPREHENSIVE METABOLIC PANEL
ALT: 14 U/L (ref 0–35)
AST: 18 U/L (ref 0–37)
Albumin: 4.4 g/dL (ref 3.5–5.2)
Alkaline Phosphatase: 55 U/L (ref 39–117)
BUN: 11 mg/dL (ref 6–23)
CO2: 26 mEq/L (ref 19–32)
Calcium: 9.5 mg/dL (ref 8.4–10.5)
Chloride: 105 mEq/L (ref 96–112)
Creatinine, Ser: 0.86 mg/dL (ref 0.40–1.20)
GFR: 84.62 mL/min (ref >60.00)
Glucose, Bld: 89 mg/dL (ref 70–99)
Potassium: 4.4 mEq/L (ref 3.5–5.1)
Sodium: 137 mEq/L (ref 135–145)
Total Bilirubin: 1.9 mg/dL — ABNORMAL HIGH (ref 0.2–1.2)
Total Protein: 7.1 g/dL (ref 6.0–8.3)

## 2020-05-22 LAB — C-REACTIVE PROTEIN: CRP: <1 mg/dL (ref 0.5–20.0)

## 2020-05-22 LAB — CBC WITH DIFFERENTIAL/PLATELET
Basophils Absolute: 0.1 10*3/uL (ref 0.0–0.1)
Basophils Relative: 1 % (ref 0.0–3.0)
Eosinophils Absolute: 0.2 10*3/uL (ref 0.0–0.7)
Eosinophils Relative: 4.1 % (ref 0.0–5.0)
HCT: 44 % (ref 36.0–46.0)
Hemoglobin: 14.8 g/dL (ref 12.0–15.0)
Lymphocytes Relative: 33 % (ref 12.0–46.0)
Lymphs Abs: 1.9 10*3/uL (ref 0.7–4.0)
MCHC: 33.7 g/dL (ref 30.0–36.0)
MCV: 95.4 fl (ref 78.0–100.0)
Monocytes Absolute: 0.4 10*3/uL (ref 0.1–1.0)
Monocytes Relative: 7.7 % (ref 3.0–12.0)
Neutro Abs: 3.2 10*3/uL (ref 1.4–7.7)
Neutrophils Relative %: 54.2 % (ref 43.0–77.0)
Platelets: 360 10*3/uL (ref 150.0–400.0)
RBC: 4.61 Mil/uL (ref 3.87–5.11)
RDW: 11.8 % (ref 11.5–15.5)
WBC: 5.8 10*3/uL (ref 4.0–10.5)

## 2020-05-22 LAB — SEDIMENTATION RATE: Sed Rate: 15 mm/hr (ref 0–20)

## 2020-05-22 LAB — TSH: TSH: 1.69 u[IU]/mL (ref 0.35–4.50)

## 2020-05-22 NOTE — Patient Instructions (Addendum)
1) lots of labs for your stomach/bowels. im going to refer to GI as well because you may be needing a colonoscopy.   2) I wonder if you have a cystocele and ? Prolapse that is interfering with tampon. Need to do an exam to know for sure. Did referral to uro-gynecology. Let me know if you don't hear from anyone.   So good to see you! Hang in there Aw

## 2020-05-22 NOTE — Progress Notes (Signed)
Patient: Carrie Morrison MRN: [id number] DOB: August 15, 1980 PCP: Orma Flaming, MD     Subjective:  Chief Complaint  Patient presents with  . Menstrual Problem  . Irritable Bowel Syndrome    HPI: The patient is a 40 y.o. female who presents today for irregular cycles, and IBS complications.    She got sick back in August and started a flair up of her IBS and she was having a lot of discomfort as a result. She kept putting it off and it just hasn't gotten better. She is embarrassed to even talk about this. She has more diarrhea IBS. She has had this since she was a teen, but over the past few month things are way outside of her normal. she normally has diarrhea everytime she eats. Denies any blood in her stool, but describes the stool as more "tarry in color." the fecal incontinence is new to her. She states this is daily. She doesn't have feces in her panties, but she feels like she can not wipe clean. Doesn't really see anything on her clothes. She doesn't really have stomach pain out of the normal. No nausea or vomiting. She has tried a gluten free diet and she did feel better on this. She has family history of colon cancer in her paternal grandmother. Her father died of a heart attack at 12 years. No hx of IBD.   She also around this same time was unable to wear her tampon because she couldn't void. It doesn't happen every time, but it happens often. She has always had a hard time with her periods. They have been heavy. She doesn't have urinary incontinence, but feels like she always has to go which may have gotten worse with her bowel issues. She doesn't have pain with sex. She did not have a vaginal birth. Doesn't feel like anything is falling out. She does get a burning discomfort when she gets her period. She does have breakthrough bleeding when she runs at times. Also has with ovulation.   Review of Systems  Constitutional: Negative for fatigue, fever and unexpected weight change.  Eyes:  Negative for visual disturbance.  Respiratory: Negative for cough and shortness of breath.   Cardiovascular: Negative for chest pain, palpitations and leg swelling.  Gastrointestinal: Positive for diarrhea. Negative for abdominal distention, abdominal pain, blood in stool, nausea and vomiting.  Genitourinary: Negative for frequency and pelvic pain.  Musculoskeletal: Negative for arthralgias.  Skin: Negative for rash.    Allergies Patient is allergic to fluconazole in dextrose and latex.  Past Medical History Patient  has a past medical history of GERD (gastroesophageal reflux disease), Hyperlipemia, and Pulmonary nodule, right.  Surgical History Patient  has a past surgical history that includes Cesarean section (11/04/09); Mouth surgery (1995); and Rhinoplasty (2009).  Family History Pateint's family history includes Asthma in her brother; Colon cancer in her maternal grandfather, maternal grandmother, and paternal grandmother; Diabetes in her paternal grandmother; Heart attack in her paternal grandfather; Heart attack (age of onset: 54) in her father; Heart attack (age of onset: 62) in her mother; Heart disease in her father and mother; Hyperlipidemia in her father; Hypertension in her father; Stroke in her paternal grandmother.  Social History Patient  reports that she quit smoking about 11 years ago. She has never used smokeless tobacco. She reports current alcohol use of about 7.0 standard drinks of alcohol per week. She reports that she does not use drugs.    Objective: Vitals:   05/22/20 1005  BP:  119/72  Pulse: 78  Temp: 98.1 F (36.7 C)  TempSrc: Temporal  SpO2: 100%  Weight: 146 lb 9.6 oz (66.5 kg)  Height: 5\' 2"  (1.575 m)    Body mass index is 26.81 kg/m.  Physical Exam Vitals reviewed.  Constitutional:      Appearance: Normal appearance. She is well-developed, normal weight and well-nourished.  HENT:     Head: Normocephalic and atraumatic.     Right Ear:  External ear normal.     Left Ear: External ear normal.     Mouth/Throat:     Mouth: Oropharynx is clear and moist.  Eyes:     Extraocular Movements: EOM normal.     Conjunctiva/sclera: Conjunctivae normal.     Pupils: Pupils are equal, round, and reactive to light.  Neck:     Thyroid: No thyromegaly.  Cardiovascular:     Rate and Rhythm: Normal rate and regular rhythm.     Pulses: Normal pulses and intact distal pulses.     Heart sounds: Normal heart sounds. No murmur heard.   Pulmonary:     Effort: Pulmonary effort is normal.     Breath sounds: Normal breath sounds.  Abdominal:     General: Abdomen is flat. Bowel sounds are normal. There is no distension.     Palpations: Abdomen is soft.     Tenderness: There is no abdominal tenderness.     Comments: Negative murphy's sign   Musculoskeletal:        General: No swelling or tenderness. Normal range of motion.     Cervical back: Normal range of motion and neck supple.  Lymphadenopathy:     Cervical: No cervical adenopathy.  Skin:    General: Skin is warm and dry.     Capillary Refill: Capillary refill takes less than 2 seconds.     Findings: No rash.  Neurological:     General: No focal deficit present.     Mental Status: She is alert and oriented to person, place, and time.     Cranial Nerves: No cranial nerve deficit.     Coordination: Coordination normal.     Deep Tendon Reflexes: Reflexes normal.  Psychiatric:        Mood and Affect: Mood and affect and mood normal.        Behavior: Behavior normal.        Assessment/plan: 1. Diarrhea, unspecified type Hx of IBS-diarrhea for >20+ years. Does have a change in bowel habits. Declines rectal exam today due to her being on her period. Did encourage her as she does not have true fecal incontinence. Will start work up with labs including thyroid, inflammatory markers and celiac panel. Stool studies aren't out of question, but has had this for years. Referral for GI done as  well as she likely needs cscope and rectal exam.  - CBC with Differential/Platelet - Comprehensive metabolic panel - TSH - Sedimentation rate - C-reactive protein - Gliadin antibodies, serum - Tissue transglutaminase, IgA - Reticulin Antibody, IgA w reflex titer - Ambulatory referral to Gastroenterology  2. Change in bowel movement  - Ambulatory referral to Gastroenterology  3. Cystocele with incomplete uterovaginal prolapse Can not examine her today due to being on period. From her history I wonder if she has a cystocele causing her symptoms. Will refer to uro-gyn.  - Ambulatory referral to Urogynecology     This visit occurred during the SARS-CoV-2 public health emergency.  Safety protocols were in place, including screening questions prior to  the visit, additional usage of staff PPE, and extensive cleaning of exam room while observing appropriate contact time as indicated for disinfecting solutions.     Return if symptoms worsen or fail to improve.   Orma Flaming, MD [place]   05/22/2020

## 2020-05-30 LAB — RETICULIN ANTIBODIES, IGA W TITER: Reticulin IgA Screen: NEGATIVE

## 2020-05-30 LAB — GLIADIN ANTIBODIES, SERUM
Gliadin IgA: 7 U/mL
Gliadin IgG: <1 U/mL

## 2020-05-30 LAB — TISSUE TRANSGLUTAMINASE, IGA: (tTG) Ab, IgA: <1 U/mL

## 2020-07-11 ENCOUNTER — Other Ambulatory Visit: Payer: Self-pay

## 2020-07-11 ENCOUNTER — Encounter: Payer: Self-pay | Admitting: Gastroenterology

## 2020-07-11 ENCOUNTER — Ambulatory Visit (INDEPENDENT_AMBULATORY_CARE_PROVIDER_SITE_OTHER): Payer: 59 | Admitting: Gastroenterology

## 2020-07-11 VITALS — BP 110/60 | HR 75 | Ht 62.0 in | Wt 149.0 lb

## 2020-07-11 DIAGNOSIS — R102 Pelvic and perineal pain: Secondary | ICD-10-CM

## 2020-07-11 DIAGNOSIS — R194 Change in bowel habit: Secondary | ICD-10-CM

## 2020-07-11 MED ORDER — PLENVU 140 G PO SOLR
1.0000 | ORAL | 0 refills | Status: DC
Start: 2020-07-11 — End: 2021-06-20

## 2020-07-11 NOTE — Patient Instructions (Signed)
If you are age 41 or older, your body mass index should be between 23-30. Your Body mass index is 27.25 kg/m. If this is out of the aforementioned range listed, please consider follow up with your Primary Care Provider.  If you are age 53 or younger, your body mass index should be between 19-25. Your Body mass index is 27.25 kg/m. If this is out of the aformentioned range listed, please consider follow up with your Primary Care Provider.   You have been scheduled for a colonoscopy. Please follow written instructions given to you at your visit today.  Please pick up your prep supplies at the pharmacy within the next 1-3 days. If you use inhalers (even only as needed), please bring them with you on the day of your procedure.  You will be contacted by [facility] in the next 2 days to arrange a Abdominal US and Pelvic Ultrasound.  The number on your caller ID will be [phone], please answer when they call.  If you have not heard from them in 2 days please call [phone] to schedule. You will be set up to have your ultrasound at our new [place] Location here in [place].  We will contact you with the results of your ultrasound.  Follow up pending at this time.  It was a pleasure to see you today!  Dr. Loletha Carrow

## 2020-07-11 NOTE — Progress Notes (Addendum)
[facility] Consult Note:  History: Carrie Morrison 07/11/2020  Referring provider: Orma Flaming, MD  Reason for consult/chief complaint: Irritable Bowel Syndrome ("I feel like I can't empty completely" Increased abd pain with some alternating diarrhea and constipation. This started having symptoms in Aug 2021. )   Subjective  HPI:  This is a very pleasant 40 year old woman referred by primary care to see [place] for abdominal/pelvic pain and altered bowel habits. Carrie Morrison reports lifelong "stomach problems" with a diagnosis of IBS decades ago.  It was primarily diarrhea and intermittent cramps.  She was able to control that for the most part with diet and lifestyle changes. She developed recurrent digestive symptoms last summer, and recalls that it began after some travel and a respiratory illness, and she feels that she has just not been well since then.  She did not have acute digestive symptoms such as a gastroenteritis, but she started experiencing bandlike lower abdominal and pelvic pain during bowel movements.  Her stools have always been soft with intermittent loose stool perhaps triggered by food or stress.  However the pain was a new symptom and will occur during bowel movements to the point that she feels that it leads to incomplete evacuation. Her appetite is generally been good and her weight stable.  She denies rectal bleeding.  There was significant stress from her husband being in a severe MVA in October 2021, the symptoms predated that.  She is not had any new medicines or other changes in her health. She reports gynecologic problems of cysts many years ago, has not seen a gynecologist in years because her previous GYN physician retired and her primary care provider was able to do pelvic exams.  Her cycle is irregular, and during the menstrual cycle she has some pelvic pain and she wonders if it is recurrence of cysts, it also feels to her that it may be affecting  the bowel movements.  She also has frequent urination and wonders if that is related to any of this.   ROS:  Review of Systems  Constitutional: Negative for appetite change and unexpected weight change.  HENT: Negative for mouth sores and voice change.   Eyes: Negative for pain and redness.  Respiratory: Negative for cough and shortness of breath.   Cardiovascular: Negative for chest pain and palpitations.  Genitourinary: Negative for dysuria and hematuria.  Musculoskeletal: Negative for arthralgias and myalgias.  Skin: Negative for pallor and rash.  Neurological: Negative for weakness and headaches.  Hematological: Negative for adenopathy.  Psychiatric/Behavioral:       Anxiety     Past Medical History: Past Medical History:  Diagnosis Date  . GERD (gastroesophageal reflux disease)   . Hyperlipemia   . Pulmonary nodule, right      Past Surgical History: Past Surgical History:  Procedure Laterality Date  . CESAREAN SECTION  11/04/09  . MOUTH SURGERY  1995  . RHINOPLASTY  2009     Family History: Family History  Problem Relation Age of Onset  . Heart disease Mother   . Heart disease Father   . Hypertension Father   . Colon cancer Paternal Grandmother   . Diabetes Paternal Grandmother   . Hyperlipidemia Father   . Heart attack Mother 109  . Heart attack Father 38  . Colon cancer Maternal Grandfather   . Colon cancer Maternal Grandmother   . Stroke Paternal Grandmother   . Asthma Brother   . Heart attack Paternal Grandfather     Social History:  Social History   Socioeconomic History  . Marital status: Married    Spouse name: Not on file  . Number of children: Not on file  . Years of education: Not on file  . Highest education level: Not on file  Occupational History  . Not on file  Tobacco Use  . Smoking status: Former Smoker    Quit date: 12/23/2008    Years since quitting: 11.5  . Smokeless tobacco: Never Used  Substance and Sexual Activity  .  Alcohol use: Yes    Alcohol/week: 7.0 standard drinks    Types: 7 Glasses of wine per week  . Drug use: No  . Sexual activity: Yes    Partners: Male    Birth control/protection: None    Comment: 1st intercourse 40 yo-Fewer than 5 partners  Other Topics Concern  . Not on file  Social History Narrative  . Not on file   Social Determinants of Health   Financial Resource Strain: Not on file  Food Insecurity: Not on file  Transportation Needs: Not on file  Physical Activity: Not on file  Stress: Not on file  Social Connections: Not on file    Allergies: Allergies  Allergen Reactions  . Fluconazole In Dextrose Other (See Comments)    Tongue /throat swelled up  . Latex Anaphylaxis, Hives, Shortness Of Breath and Itching    Tongue and throat swelling    Outpatient Meds: Current Outpatient Medications  Medication Sig Dispense Refill  . EPINEPHrine 0.3 mg/0.3 mL IJ SOAJ injection Inject 0.3 mg into the muscle as needed. Reported on 05/23/2015    . PEG-KCl-NaCl-NaSulf-Na Asc-C (PLENVU) 140 g SOLR Take 1 kit by mouth as directed. 1 each 0   No current facility-administered medications for this visit.      ___________________________________________________________________ Objective   Exam:  BP 110/60   Pulse 75   Ht _0  (1.575 m)   Wt 149 lb (67.6 kg)   SpO2 99%   BMI 27.25 kg/m  Wt Readings from Last 3 Encounters:  07/11/20 149 lb (67.6 kg)  05/22/20 146 lb 9.6 oz (66.5 kg)  09/25/19 136 lb (61.7 kg)   Exam chaperoned by CMA  General: Well-appearing  Eyes: sclera anicteric, no redness  ENT: oral mucosa moist without lesions, no cervical or supraclavicular lymphadenopathy  CV: RRR without murmur, S1/S2, no JVD, no peripheral edema  Resp: clear to auscultation bilaterally, normal RR and effort noted  GI: soft, nondistended, with active bowel sounds. No guarding or palpable organomegaly noted.suprapubic fullness with tenderness  Skin; warm and dry, no rash  or jaundice noted  Neuro: awake, alert and oriented x 3. Normal gross motor function and fluent speech Rectal: Normal perianal exam, no fissure or palpable internal lesion.  Descended cervix felt anteriorly (stool heme-negative) Labs:  CBC Latest Ref Rng & Units 05/22/2020 08/03/2017 05/23/2015  WBC 4.0 - 10.5 K/uL 5.8 6.5 6.5  Hemoglobin 12.0 - 15.0 g/dL 14.8 14.8 14.2  Hematocrit 36.0 - 46.0 % 44.0 43.8 42.9  Platelets 150.0 - 400.0 K/uL 360.0 384.0 375   CMP Latest Ref Rng & Units 05/22/2020 08/03/2017 05/23/2015  Glucose 70 - 99 mg/dL 89 65(L) 82  BUN 6 - 23 mg/dL _1 Creatinine 0.40 - 1.20 mg/dL 0.86 0.70 0.76  Sodium 135 - 145 mEq/L 137 139 137  Potassium 3.5 - 5.1 mEq/L 4.4 4.4 4.2  Chloride 96 - 112 mEq/L 105 102 102  CO2 19 - 32 mEq/L 26 27 27  Calcium 8.4 - 10.5 mg/dL 9.5 9.8 9.7  Total Protein 6.0 - 8.3 g/dL 7.1 7.3 6.9  Total Bilirubin 0.2 - 1.2 mg/dL 1.9(H) 1.6(H) 0.8  Alkaline Phos 39 - 117 U/L 55 52 58  AST 0 - 37 U/L _0 ALT 0 - 35 U/L _1 CRP nml  TSH nml  Neg tTG IgA  No stool tests  No radiologic studies for review  Assessment: Encounter Diagnoses  Name Primary?  . Pelvic pain Yes  . Change in bowel habits     The nature of this lower abdominal/pelvic pain is unclear at present.  She has a history of gynecologic problems for which we currently do not have any records.  She also has a suprapubic fullness and tenderness, I wonder if she could have fibroids or other gynecologic pathology.  It is also not yet clear if the change in bowel habits is a primary lower digestive condition or also perhaps related to something gynecologic, or whether both things may be occurring.  Plan: We scheduled an abdominal and pelvic ultrasound, both transabdominal and transvaginal.  Gynecologic evaluation if abnormality seen on that.  Colonoscopy.  She is agreeable after discussion of procedure and risks.  The benefits and risks of the planned procedure were  described in detail with the patient or (when appropriate) their health care proxy.  Risks were outlined as including, but not limited to, bleeding, infection, perforation, adverse medication reaction leading to cardiac or pulmonary decompensation, pancreatitis (if ERCP).  The limitation of incomplete mucosal visualization was also discussed.  No guarantees or warranties were given.  Thank you for the courtesy of this consult.  Please call me with any questions or concerns. (She has been concerned about the symptoms and had not been able to make the time to have it evaluated since she was so busy dealing with her husband's illness and injury.  She is therefore glad it is getting some more attention now)  Nelida Meuse III  CC: Referring provider noted above

## 2020-07-15 ENCOUNTER — Telehealth: Payer: Self-pay | Admitting: Gastroenterology

## 2020-07-15 NOTE — Telephone Encounter (Signed)
Advised Carrie Morrison that the patient has been having a change in her bowel habits and had a history of cysts.

## 2020-07-16 ENCOUNTER — Ambulatory Visit (HOSPITAL_BASED_OUTPATIENT_CLINIC_OR_DEPARTMENT_OTHER)
Admission: RE | Admit: 2020-07-16 | Discharge: 2020-07-16 | Disposition: A | Discharge status: Home / Self Care | Payer: 59 | Source: Ambulatory Visit | Attending: Gastroenterology | Admitting: Gastroenterology

## 2020-07-16 ENCOUNTER — Other Ambulatory Visit: Payer: Self-pay

## 2020-07-16 DIAGNOSIS — R102 Pelvic and perineal pain: Secondary | ICD-10-CM | POA: Insufficient documentation

## 2020-07-16 DIAGNOSIS — D259 Leiomyoma of uterus, unspecified: Secondary | ICD-10-CM | POA: Insufficient documentation

## 2020-08-27 ENCOUNTER — Encounter: Payer: 59 | Admitting: Gastroenterology

## 2020-09-20 ENCOUNTER — Encounter: Payer: 59 | Admitting: Gastroenterology

## 2021-05-04 DIAGNOSIS — D500 Iron deficiency anemia secondary to blood loss (chronic): Secondary | ICD-10-CM

## 2021-05-04 HISTORY — DX: Iron deficiency anemia secondary to blood loss (chronic): D50.0

## 2021-06-17 ENCOUNTER — Ambulatory Visit (INDEPENDENT_AMBULATORY_CARE_PROVIDER_SITE_OTHER): Payer: 59 | Admitting: Nurse Practitioner

## 2021-06-17 ENCOUNTER — Other Ambulatory Visit: Payer: Self-pay

## 2021-06-17 ENCOUNTER — Encounter (HOSPITAL_BASED_OUTPATIENT_CLINIC_OR_DEPARTMENT_OTHER): Payer: Self-pay | Admitting: Nurse Practitioner

## 2021-06-17 VITALS — BP 122/82 | HR 83 | Ht 62.0 in | Wt 144.0 lb

## 2021-06-17 DIAGNOSIS — Z8249 Family history of ischemic heart disease and other diseases of the circulatory system: Secondary | ICD-10-CM | POA: Diagnosis not present

## 2021-06-17 DIAGNOSIS — Z139 Encounter for screening, unspecified: Secondary | ICD-10-CM

## 2021-06-17 DIAGNOSIS — D219 Benign neoplasm of connective and other soft tissue, unspecified: Secondary | ICD-10-CM | POA: Insufficient documentation

## 2021-06-17 DIAGNOSIS — K589 Irritable bowel syndrome without diarrhea: Secondary | ICD-10-CM | POA: Diagnosis not present

## 2021-06-17 DIAGNOSIS — T7840XA Allergy, unspecified, initial encounter: Secondary | ICD-10-CM | POA: Insufficient documentation

## 2021-06-17 DIAGNOSIS — T7840XS Allergy, unspecified, sequela: Secondary | ICD-10-CM

## 2021-06-17 MED ORDER — EPINEPHRINE 0.3 MG/0.3ML IJ SOAJ: 0.3000 mg | INTRAMUSCULAR | 1 refills | Status: AC | PRN

## 2021-06-17 NOTE — Patient Instructions (Signed)
Thank you for choosing [place] [facility] for your Primary Care needs. I am excited for the opportunity to partner with you to meet your health care goals. It was a pleasure meeting you today!  Recommendations from today's visit: I have sent the referral to [facility] they should be in touch with you in the next week to set up an appointment.  I sent in the epi pen for you  Information on diet, exercise, and health maintenance recommendations are listed below. This is information to help you be sure you are on track for optimal health and monitoring.   Please look over this and let us know if you have any questions or if you have completed any of the health maintenance outside of [place] so that we can be sure your records are up to date.  ___________________________________________________________ About Me: I am an Adult-Geriatric Nurse Practitioner with a background in caring for patients for more than 20 years with a strong intensive care background. I provide primary care and sports medicine services to patients age 29 and older within this office. My education had a strong focus on caring for the older adult population, which I am passionate about. I am also the director of the APP Fellowship with [facility].   My desire is to provide you with the best service through preventive medicine and supportive care. I consider you a part of the medical team and value your input. I work diligently to ensure that you are heard and your needs are met in a safe and effective manner. I want you to feel comfortable with me as your provider and want you to know that your health concerns are important to me.  For your information, our office hours are: Monday, Tuesday, and Thursday 8:00 AM - 5:00 PM Wednesday and Friday 8:00 AM - 12:00 PM.   In my time away from the office I am teaching new APP's within the system and am unavailable, but my partner, Dr. Burnard Bunting is in the office  for emergent needs.   If you have questions or concerns, please call our office at [phone] or send [place] a MyChart message and we will respond as quickly as possible.  ____________________________________________________________ MyChart:  For all urgent or time sensitive needs we ask that you please call the office to avoid delays. Our number is [phone]. MyChart is not constantly monitored and due to the large volume of messages a day, replies may take up to 72 business hours.  MyChart Policy: MyChart allows for you to see your visit notes, after visit summary, provider recommendations, lab and tests results, make an appointment, request refills, and contact your provider or the office for non-urgent questions or concerns. Providers are seeing patients during normal business hours and do not have built in time to review MyChart messages.  We ask that you allow a minimum of 3 business days for responses to [employer]. For this reason, please do not send urgent requests through [place]. Please call the office at [phone]. New and ongoing conditions may require a visit. We have virtual and in person visit available for your convenience.  Complex MyChart concerns may require a visit. Your provider may request you schedule a virtual or in person visit to ensure we are providing the best care possible. MyChart messages sent after 11:00 AM on Friday will not be received by the provider until Monday morning.    Lab and Test Results: You will receive your  lab and test results on MyChart as soon as they are completed and results have been sent by the lab or testing facility. Due to this service, you will receive your results BEFORE your provider.  I review lab and tests results each morning prior to seeing patients. Some results require collaboration with other providers to ensure you are receiving the most appropriate care. For this reason, we ask that you please allow a minimum of 3-5  business days from the time the ALL results have been received for your provider to receive and review lab and test results and contact you about these.  Most lab and test result comments from the provider will be sent through [place]. Your provider may recommend changes to the plan of care, follow-up visits, repeat testing, ask questions, or request an office visit to discuss these results. You may reply directly to this message or call the office at [phone] to provide information for the provider or set up an appointment. In some instances, you will be called with test results and recommendations. Please let us know if this is preferred and we will make note of this in your chart to provide this for you.    If you have not heard a response to your lab or test results in 5 business days from all results returning to [place], please call the office to let us know. We ask that you please avoid calling prior to this time unless there is an emergent concern. Due to high call volumes, this can delay the resulting process.  After Hours: For all non-emergency after hours needs, please call the office at [phone] and select the option to reach the on-call provider service. On-call services are shared between multiple [facility] offices and therefore it will not be possible to speak directly with your provider. On-call providers may provide medical advice and recommendations, but are unable to provide refills for maintenance medications.  For all emergency or urgent medical needs after normal business hours, we recommend that you seek care at the closest Urgent Care or Emergency Department to ensure appropriate treatment in a timely manner.  [facility] has a 24 hour emergency room located on the ground floor for your convenience.   Urgent Concerns During the Business Day Providers are seeing patients from 8AM to [place] with a busy schedule and are most often not able to respond to  non-urgent calls until the end of the day or the next business day. If you should have URGENT concerns during the day, please call and speak to the nurse or schedule a same day appointment so that we can address your concern without delay.   Thank you, again, for choosing me as your health care partner. I appreciate your trust and look forward to learning more about you.   Worthy Keeler, DNP, AGNP-c ___________________________________________________________  Health Maintenance Recommendations Screening Testing Mammogram Every 1 -2 years based on history and risk factors Starting at age 24 Pap Smear Ages 21-39 every 3 years Ages 33-65 every 5 years with HPV testing More frequent testing may be required based on results and history Colon Cancer Screening Every 1-10 years based on test performed, risk factors, and history Starting at age 62 Bone Density Screening Every 2-10 years based on history Starting at age 50 for women Recommendations for men differ based on medication usage, history, and risk factors AAA Screening One time ultrasound Men 12-93 years old who have every smoked Lung Cancer Screening Low Dose Lung  CT every 12 months Age 14-80 years with a 30 pack-year smoking history who still smoke or who have quit within the last 15 years  Screening Labs Routine  Labs: Complete Blood Count (CBC), Complete Metabolic Panel (CMP), Cholesterol (Lipid Panel) Every 6-12 months based on history and medications May be recommended more frequently based on current conditions or previous results Hemoglobin A1c Lab Every 3-12 months based on history and previous results Starting at age 23 or earlier with diagnosis of diabetes, high cholesterol, BMI >26, and/or risk factors Frequent monitoring for patients with diabetes to ensure blood sugar control Thyroid Panel (TSH w/ T3 & T4) Every 6 months based on history, symptoms, and risk factors May be repeated more often if on  medication HIV One time testing for all patients 82 and older May be repeated more frequently for patients with increased risk factors or exposure Hepatitis C One time testing for all patients 62 and older May be repeated more frequently for patients with increased risk factors or exposure Gonorrhea, Chlamydia Every 12 months for all sexually active persons 13-24 years Additional monitoring may be recommended for those who are considered high risk or who have symptoms PSA Men 53-69 years old with risk factors Additional screening may be recommended from age 41-69 based on risk factors, symptoms, and history  Vaccine Recommendations Tetanus Booster All adults every 10 years Flu Vaccine All patients 6 months and older every year COVID Vaccine All patients 12 years and older Initial dosing with booster May recommend additional booster based on age and health history HPV Vaccine 2 doses all patients age 8-26 Dosing may be considered for patients over 26 Shingles Vaccine (Shingrix) 2 doses all adults 81 years and older Pneumonia (Pneumovax 23) All adults 35 years and older May recommend earlier dosing based on health history Pneumonia (Prevnar 73) All adults 81 years and older Dosed 1 year after Pneumovax 23  Additional Screening, Testing, and Vaccinations may be recommended on an individualized basis based on family history, health history, risk factors, and/or exposure.  __________________________________________________________  Diet Recommendations for All Patients  I recommend that all patients maintain a diet low in saturated fats, carbohydrates, and cholesterol. While this can be challenging at first, it is not impossible and small changes can make big differences.  Things to try: Decreasing the amount of soda, sweet tea, and/or juice to one or less per day and replace with water While water is always the first choice, if you do not like water you may consider adding a  water additive without sugar to improve the taste other sugar free drinks Replace potatoes with a brightly colored vegetable at dinner Use healthy oils, such as canola oil or olive oil, instead of butter or hard margarine Limit your bread intake to two pieces or less a day Replace regular pasta with low carb pasta options Bake, broil, or grill foods instead of frying Monitor portion sizes  Eat smaller, more frequent meals throughout the day instead of large meals  An important thing to remember is, if you love foods that are not great for your health, you don't have to give them up completely. Instead, allow these foods to be a reward when you have done well. Allowing yourself to still have special treats every once in a while is a nice way to tell yourself thank you for working hard to keep yourself healthy.   Also remember that every day is a new day. If you have a bad day and "fall  off the wagon", you can still climb right back up and keep moving along on your journey!  We have resources available to help you!  Some websites that may be helpful include: www.http://carter.biz/  Www.VeryWellFit.com _____________________________________________________________  Activity Recommendations for All Patients  I recommend that all adults get at least 20 minutes of moderate physical activity that elevates your heart rate at least 5 days out of the week.  Some examples include: Walking or jogging at a pace that allows you to carry on a conversation Cycling (stationary bike or outdoors) Water aerobics Yoga Weight lifting Dancing If physical limitations prevent you from putting stress on your joints, exercise in a pool or seated in a chair are excellent options.  Do determine your MAXIMUM heart rate for activity: YOUR AGE - 220 = MAX HeartRate   Remember! Do not push yourself too hard.  Start slowly and build up your pace, speed, weight, time in exercise, etc.  Allow your body to rest between  exercise and get good sleep. You will need more water than normal when you are exerting yourself. Do not wait until you are thirsty to drink. Drink with a purpose of getting in at least 8, 8 ounce glasses of water a day plus more depending on how much you exercise and sweat.    If you begin to develop dizziness, chest pain, abdominal pain, jaw pain, shortness of breath, headache, vision changes, lightheadedness, or other concerning symptoms, stop the activity and allow your body to rest. If your symptoms are severe, seek emergency evaluation immediately. If your symptoms are concerning, but not severe, please let us know so that we can recommend further evaluation.

## 2021-06-18 LAB — CBC WITH DIFFERENTIAL/PLATELET
Basophils Absolute: 0.1 10*3/uL (ref 0.0–0.2)
Basos: 1 %
EOS (ABSOLUTE): 0.4 10*3/uL (ref 0.0–0.4)
Eos: 7 %
Hematocrit: 42.6 % (ref 34.0–46.6)
Hemoglobin: 14.7 g/dL (ref 11.1–15.9)
Immature Grans (Abs): 0 10*3/uL (ref 0.0–0.1)
Immature Granulocytes: 0 %
Lymphocytes Absolute: 2 10*3/uL (ref 0.7–3.1)
Lymphs: 34 %
MCH: 32.5 pg (ref 26.6–33.0)
MCHC: 34.5 g/dL (ref 31.5–35.7)
MCV: 94 fL (ref 79–97)
Monocytes Absolute: 0.4 10*3/uL (ref 0.1–0.9)
Monocytes: 7 %
Neutrophils Absolute: 3.1 10*3/uL (ref 1.4–7.0)
Neutrophils: 51 %
Platelets: 366 10*3/uL (ref 150–450)
RBC: 4.53 x10E6/uL (ref 3.77–5.28)
RDW: 11.5 % — ABNORMAL LOW (ref 11.7–15.4)
WBC: 6 10*3/uL (ref 3.4–10.8)

## 2021-06-18 LAB — LIPID PANEL
Chol/HDL Ratio: 2.6 ratio (ref 0.0–4.4)
Cholesterol, Total: 199 mg/dL (ref 100–199)
HDL: 76 mg/dL (ref >39)
LDL Chol Calc (NIH): 109 mg/dL — ABNORMAL HIGH (ref 0–99)
Triglycerides: 76 mg/dL (ref 0–149)
VLDL Cholesterol Cal: 14 mg/dL (ref 5–40)

## 2021-06-18 LAB — COMPREHENSIVE METABOLIC PANEL
ALT: 12 IU/L (ref 0–32)
AST: 22 IU/L (ref 0–40)
Albumin/Globulin Ratio: 1.6 (ref 1.2–2.2)
Albumin: 4.5 g/dL (ref 3.8–4.8)
Alkaline Phosphatase: 68 IU/L (ref 44–121)
BUN/Creatinine Ratio: 16 (ref 9–23)
BUN: 14 mg/dL (ref 6–24)
Bilirubin Total: 0.6 mg/dL (ref 0.0–1.2)
CO2: 20 mmol/L (ref 20–29)
Calcium: 9.4 mg/dL (ref 8.7–10.2)
Chloride: 104 mmol/L (ref 96–106)
Creatinine, Ser: 0.86 mg/dL (ref 0.57–1.00)
Globulin, Total: 2.8 g/dL (ref 1.5–4.5)
Glucose: 95 mg/dL (ref 70–99)
Potassium: 4.6 mmol/L (ref 3.5–5.2)
Sodium: 141 mmol/L (ref 134–144)
Total Protein: 7.3 g/dL (ref 6.0–8.5)
eGFR: 87 mL/min/{1.73_m2} (ref >59)

## 2021-06-18 LAB — TSH: TSH: 2.66 u[IU]/mL (ref 0.450–4.500)

## 2021-06-20 DIAGNOSIS — D239 Other benign neoplasm of skin, unspecified: Secondary | ICD-10-CM | POA: Insufficient documentation

## 2021-06-20 DIAGNOSIS — K589 Irritable bowel syndrome without diarrhea: Secondary | ICD-10-CM | POA: Insufficient documentation

## 2021-06-20 DIAGNOSIS — Z139 Encounter for screening, unspecified: Secondary | ICD-10-CM | POA: Insufficient documentation

## 2021-06-20 DIAGNOSIS — Z8249 Family history of ischemic heart disease and other diseases of the circulatory system: Secondary | ICD-10-CM | POA: Insufficient documentation

## 2021-06-20 NOTE — Progress Notes (Signed)
Carrie Render, DNP, AGNP-c [facility] [address]   [place], [place] [address] [phone]  New patient visit   Patient: Carrie Morrison   DOB: [birth year]   41 y.o. Female  MRN: [id number] Visit Date: 06/17/2021  Patient Care Team: Pcp, No as PCP - General  Today's healthcare provider: Orma Render, NP   Chief Complaint  Patient presents with   New Patient (Initial Visit)    Patient presents today to establish care. She has an ultrasound and never received the results. Would like for you to go them results. She would like a referral to OB/GYN. Patient stated she needs a refill on her EPIpen   Subjective    Carrie Morrison is a 41 y.o. female who presents today as a new patient to establish care.    Patient endorses the following concerns presently: Discussion of ultrasound results by previous provider Patient repots she had a pelvic and transvaginal ultrasound completed for pelvic pain on 07/16/2020.  She would ike me to go over these results with her today. Ultrasound reveals presence of uterine fibroids with enlarged uterus.  Patient tells me she has abdominal bloating and heavy bleeding during her periods. She reports that she is also sexually active with her husband and they have not used any form of contraception, but have not conceived since her 3 year old daughter was born. She is concerned that the fibroids could be causing her symptoms and would like a referral to GYN for further evaluation and recommendations.   She reports a hx of IBS with recent changes in bowel habits. She tells me that she often has a sensation of incomplete emptying and on occasion has difficulty with complete elimination as evidenced by inability to fully achieve cleansing after bowel movement.  She reports her grandmother did have a history of colon cancer and the changes that she is noticing are concerning for her.  She would like a referral to  gastroenterology for further evaluation.  She tells me she has a strong family history of heart disease.  Her father had onset of heart disease around the age of 88 and passed away at age 31.  Her mom had onset at age 79.  She is very physically active and runs on a routine basis to keep herself fit but she would like to know what else she can do to help prevent or reduce her risks of cardiovascular disease given her family history.  History reviewed and reveals the following: Past Medical History:  Diagnosis Date   GERD (gastroesophageal reflux disease)    Hyperlipemia    Pulmonary nodule, right    Past Surgical History:  Procedure Laterality Date   CESAREAN SECTION  11/04/09   MOUTH SURGERY  1995   RHINOPLASTY  2009   Family Status  Relation Name Status   Father  Deceased at age 67   Mother  Alive   PGF  Deceased at age 75   PGM  (Not Specified)   MGF  (Not Specified)   MGM  (Not Specified)   Brother  (Not Specified)   Family History  Problem Relation Age of Onset   Heart disease Mother    Heart disease Father    Hypertension Father    Colon cancer Paternal Grandmother    Diabetes Paternal Grandmother    Hyperlipidemia Father    Heart attack Mother 89   Heart attack Father 12   Colon cancer Maternal Grandfather  Colon cancer Maternal Grandmother    Stroke Paternal Grandmother    Asthma Brother    Heart attack Paternal Grandfather    Social History   Socioeconomic History   Marital status: Married    Spouse name: Not on file   Number of children: Not on file   Years of education: Not on file   Highest education level: Not on file  Occupational History   Not on file  Tobacco Use   Smoking status: Former    Types: Cigarettes    Quit date: 12/23/2008    Years since quitting: 12.4   Smokeless tobacco: Never  Substance and Sexual Activity   Alcohol use: Yes    Alcohol/week: 7.0 standard drinks    Types: 7 Glasses of wine per week   Drug use: No   Sexual  activity: Yes    Partners: Male    Birth control/protection: None    Comment: 1st intercourse 41 yo-Fewer than 5 partners  Other Topics Concern   Not on file  Social History Narrative   Not on file   Social Determinants of Health   Financial Resource Strain: Not on file  Food Insecurity: Not on file  Transportation Needs: Not on file  Physical Activity: Not on file  Stress: Not on file  Social Connections: Not on file   Outpatient Medications Prior to Visit  Medication Sig   [DISCONTINUED] EPINEPHrine 0.3 mg/0.3 mL IJ SOAJ injection Inject 0.3 mg into the muscle as needed. Reported on 05/23/2015   [DISCONTINUED] PEG-KCl-NaCl-NaSulf-Na Asc-C (PLENVU) 140 g SOLR Take 1 kit by mouth as directed.   No facility-administered medications prior to visit.   Allergies  Allergen Reactions   Fluconazole In Dextrose Other (See Comments)    Tongue /throat swelled up   Latex Anaphylaxis, Hives, Shortness Of Breath and Itching    Tongue and throat swelling   Immunization History  Administered Date(s) Administered   Td 07/02/2008   Tdap 08/01/2015    Health Maintenance Due: Health Maintenance  Topic Date Due   COVID-19 Vaccine (1) Never done   HIV Screening  Never done   Hepatitis C Screening  Never done   PAP SMEAR-Modifier  05/22/2018   INFLUENZA VACCINE  08/01/2021 (Originally 12/02/2020)   TETANUS/TDAP  07/31/2025   HPV VACCINES  Aged Out    Review of Systems All review of systems negative except what is listed in the HPI   Objective    BP 122/82    Pulse 83    Ht $R'5\' 2"'wW$  (1.575 m)    Wt 144 lb (65.3 kg)    SpO2 98%    BMI 26.34 kg/m  Physical Exam Vitals and nursing note reviewed.  Constitutional:      General: She is not in acute distress.    Appearance: Normal appearance.  Eyes:     Extraocular Movements: Extraocular movements intact.     Conjunctiva/sclera: Conjunctivae normal.     Pupils: Pupils are equal, round, and reactive to light.  Neck:     Vascular: No  carotid bruit.  Cardiovascular:     Rate and Rhythm: Normal rate and regular rhythm.     Pulses: Normal pulses.     Heart sounds: Normal heart sounds. No murmur heard. Pulmonary:     Effort: Pulmonary effort is normal.     Breath sounds: Normal breath sounds. No wheezing.  Abdominal:     General: Bowel sounds are normal. There is no distension.     Palpations: Abdomen  is soft.     Tenderness: There is no abdominal tenderness. There is no right CVA tenderness, left CVA tenderness or guarding.  Musculoskeletal:        General: Normal range of motion.     Cervical back: Normal range of motion.     Right lower leg: No edema.     Left lower leg: No edema.  Skin:    General: Skin is warm and dry.     Capillary Refill: Capillary refill takes less than 2 seconds.  Neurological:     General: No focal deficit present.     Mental Status: She is alert and oriented to person, place, and time.  Psychiatric:        Mood and Affect: Mood normal.        Behavior: Behavior normal.        Thought Content: Thought content normal.        Judgment: Judgment normal.    Results for orders placed or performed in visit on 06/17/21  CBC with Differential/Platelet  Result Value Ref Range   WBC 6.0 3.4 - 10.8 x10E3/uL   RBC 4.53 3.77 - 5.28 x10E6/uL   Hemoglobin 14.7 11.1 - 15.9 g/dL   Hematocrit 42.6 34.0 - 46.6 %   MCV 94 79 - 97 fL   MCH 32.5 26.6 - 33.0 pg   MCHC 34.5 31.5 - 35.7 g/dL   RDW 11.5 (L) 11.7 - 15.4 %   Platelets 366 150 - 450 x10E3/uL   Neutrophils 51 Not Estab. %   Lymphs 34 Not Estab. %   Monocytes 7 Not Estab. %   Eos 7 Not Estab. %   Basos 1 Not Estab. %   Neutrophils Absolute 3.1 1.4 - 7.0 x10E3/uL   Lymphocytes Absolute 2.0 0.7 - 3.1 x10E3/uL   Monocytes Absolute 0.4 0.1 - 0.9 x10E3/uL   EOS (ABSOLUTE) 0.4 0.0 - 0.4 x10E3/uL   Basophils Absolute 0.1 0.0 - 0.2 x10E3/uL   Immature Granulocytes 0 Not Estab. %   Immature Grans (Abs) 0.0 0.0 - 0.1 x10E3/uL  Comprehensive  metabolic panel  Result Value Ref Range   Glucose 95 70 - 99 mg/dL   BUN 14 6 - 24 mg/dL   Creatinine, Ser 0.86 0.57 - 1.00 mg/dL   eGFR 87 >59 mL/min/1.73   BUN/Creatinine Ratio 16 9 - 23   Sodium 141 134 - 144 mmol/L   Potassium 4.6 3.5 - 5.2 mmol/L   Chloride 104 96 - 106 mmol/L   CO2 20 20 - 29 mmol/L   Calcium 9.4 8.7 - 10.2 mg/dL   Total Protein 7.3 6.0 - 8.5 g/dL   Albumin 4.5 3.8 - 4.8 g/dL   Globulin, Total 2.8 1.5 - 4.5 g/dL   Albumin/Globulin Ratio 1.6 1.2 - 2.2   Bilirubin Total 0.6 0.0 - 1.2 mg/dL   Alkaline Phosphatase 68 44 - 121 IU/L   AST 22 0 - 40 IU/L   ALT 12 0 - 32 IU/L  Lipid panel  Result Value Ref Range   Cholesterol, Total 199 100 - 199 mg/dL   Triglycerides 76 0 - 149 mg/dL   HDL 76 >39 mg/dL   VLDL Cholesterol Cal 14 5 - 40 mg/dL   LDL Chol Calc (NIH) 109 (H) 0 - 99 mg/dL   Chol/HDL Ratio 2.6 0.0 - 4.4 ratio  TSH  Result Value Ref Range   TSH 2.660 0.450 - 4.500 uIU/mL    Assessment & Plan  Problem List Items Addressed This Visit     Fibroids    Uterine fibroids present on ultrasound in 07/2020. Discussed findings with patient and recommendations for referral to gynecology for further evaluation. We will send referral today. No alarm symptoms present today.      Relevant Orders   Ambulatory referral to Obstetrics / Gynecology   Allergies    EpiPen refill provided for history of anaphylactic reaction.      Relevant Medications   EPINEPHrine 0.3 mg/0.3 mL IJ SOAJ injection   Family history of premature coronary heart disease    Strong family history of cardiovascular disease in father starting at age 81 with death at age 36 and mother starting at age 27. We will send referral for cardiology evaluation and recommendations for surveillance and prevention. No alarm symptoms present today. We will obtain labs today for baseline evaluation.      Encounter for health-related screening - Primary   Relevant Orders   Ambulatory referral  to Cardiology   Ambulatory referral to Gastroenterology   CBC with Differential/Platelet (Completed)   Comprehensive metabolic panel (Completed)   Lipid panel (Completed)   TSH (Completed)   Irritable bowel syndrome    History of IBS with new symptoms presenting.  She has never had a colonoscopy.  She does have a family history of colon cancer in her grandmother.  We will send referral to GI today.  No alarm symptoms present at this time.        Return in about 1 year (around 06/17/2022).     Tavi Gaughran, Coralee Pesa, NP, DNP, AGNP-C [facility] at [place]

## 2021-06-20 NOTE — Assessment & Plan Note (Signed)
History of IBS with new symptoms presenting.  She has never had a colonoscopy.  She does have a family history of colon cancer in her grandmother.  We will send referral to GI today.  No alarm symptoms present at this time.

## 2021-06-20 NOTE — Assessment & Plan Note (Signed)
EpiPen refill provided for history of anaphylactic reaction.

## 2021-06-20 NOTE — Assessment & Plan Note (Signed)
Strong family history of cardiovascular disease in father starting at age 42 with death at age 43 and mother starting at age 27. We will send referral for cardiology evaluation and recommendations for surveillance and prevention. No alarm symptoms present today. We will obtain labs today for baseline evaluation.

## 2021-06-20 NOTE — Assessment & Plan Note (Signed)
Uterine fibroids present on ultrasound in 07/2020. Discussed findings with patient and recommendations for referral to gynecology for further evaluation. We will send referral today. No alarm symptoms present today.

## 2021-07-28 NOTE — Progress Notes (Incomplete)
?Cardiology Office Note:   ? ?Date:  07/28/2021  ? ?ID:  Carrie Morrison, DOB [birth year], MRN [id number] ? ?PCP:  Pcp, No  ?Cardiologist:  None ? ?Referring MD: Orma Render, NP  ? ?No chief complaint on file. ? ? ?History of Present Illness:   ? ?Carrie Morrison is a 41 y.o. female with a hx of *** who is seen as a new consult at the request of Early, Coralee Pesa, NP for the evaluation and management of ***. ? ?Cardiovascular risk factors: ?Prior clinical ASCVD:  ?Comorbid conditions, including hypertension, hyperlipidemia, diabetes, chronic kidney disease:  ?Metabolic syndrome/Obesity: ?Chronic inflammatory conditions: ?Tobacco use history: ?Family history: ?Prior cardiac testing and/or incidental findings on other testing (ie coronary calcium): ?Exercise level: ?Current diet: ? ?Overall, ? ?*** denies any palpitations, chest pain, shortness of breath, or peripheral edema. No lightheadedness, headaches, syncope, orthopnea, or PND. ? ?Past Medical History:  ?Diagnosis Date  ? GERD (gastroesophageal reflux disease)   ? Hyperlipemia   ? Pulmonary nodule, right   ? ? ?Past Surgical History:  ?Procedure Laterality Date  ? CESAREAN SECTION  11/04/09  ? [place]  ? RHINOPLASTY  2009  ? ? ?Current Medications: ?Current Outpatient Medications on File Prior to Visit  ?Medication Sig  ? EPINEPHrine 0.3 mg/0.3 mL IJ SOAJ injection Inject 0.3 mg into the muscle as needed.  ? ?No current facility-administered medications on file prior to visit.  ?  ? ?Allergies:   Fluconazole in dextrose and Latex  ? ?Social History  ? ?Tobacco Use  ? Smoking status: Former  ?  Types: Cigarettes  ?  Quit date: 12/23/2008  ?  Years since quitting: 12.6  ? Smokeless tobacco: Never  ?Substance Use Topics  ? Alcohol use: Yes  ?  Alcohol/week: 7.0 standard drinks  ?  Types: 7 Glasses of wine per week  ? Drug use: No  ? ? ?Family History: ?family history includes Asthma in her brother; Colon cancer in her maternal grandfather, maternal grandmother,  and paternal grandmother; Diabetes in her paternal grandmother; Heart attack in her paternal grandfather; Heart attack (age of onset: 20) in her father; Heart attack (age of onset: 12) in her mother; Heart disease in her father and mother; Hyperlipidemia in her father; Hypertension in her father; Stroke in her paternal grandmother. ? ?ROS:   ?Please see the history of present illness.  Additional pertinent ROS: ?Constitutional: Negative for chills, fever, night sweats, unintentional weight loss  ?HENT: Negative for ear pain and hearing loss.   ?Eyes: Negative for loss of vision and eye pain.  ?Respiratory: Negative for cough, sputum, wheezing.   ?Cardiovascular: See HPI. ?Gastrointestinal: Negative for abdominal pain, melena, and hematochezia.  ?Genitourinary: Negative for dysuria and hematuria.  ?Musculoskeletal: Negative for falls and myalgias.  ?Skin: Negative for itching and rash.  ?Neurological: Negative for focal weakness, focal sensory changes and loss of consciousness.  ?Endo/Heme/Allergies: Does not bruise/bleed easily.   ? ? ?EKGs/Labs/Other Studies Reviewed:   ? ?The following studies were reviewed today: ? ?*** ? ?EKG:  EKG is personally reviewed.   ?*** ? ?Recent Labs: ?06/17/2021: ALT 12; BUN 14; Creatinine, Ser 0.86; Hemoglobin 14.7; Platelets 366; Potassium 4.6; Sodium 141; TSH 2.660  ? ?Recent Lipid Panel ?   ?Component Value Date/Time  ? CHOL 199 06/17/2021 1646  ? TRIG 76 06/17/2021 1646  ? HDL 76 06/17/2021 1646  ? CHOLHDL 2.6 06/17/2021 1646  ? CHOLHDL 2.4 05/23/2015 1109  ? VLDL 16 05/23/2015 1109  ?  LDLCALC 109 (H) 06/17/2021 1646  ? ? ?Physical Exam:   ? ?VS:  There were no vitals taken for this visit.   ? ?Wt Readings from Last 3 Encounters:  ?06/17/21 144 lb (65.3 kg)  ?07/11/20 149 lb (67.6 kg)  ?05/22/20 146 lb 9.6 oz (66.5 kg)  ?  ?GEN: Well nourished, well developed in no acute distress ?HEENT: Normal, moist mucous membranes ?NECK: No JVD ?CARDIAC: regular rhythm, normal S1 and S2, no  rubs or gallops. No murmur. ?VASCULAR: Radial and DP pulses 2+ bilaterally. No carotid bruits ?RESPIRATORY:  Clear to auscultation without rales, wheezing or rhonchi  ?ABDOMEN: Soft, non-tender, non-distended ?MUSCULOSKELETAL:  Ambulates independently ?SKIN: Warm and dry, no edema ?NEUROLOGIC:  Alert and oriented x 3. No focal neuro deficits noted. ?PSYCHIATRIC:  Normal affect   ? ?ASSESSMENT:   ? ?No diagnosis found. ?PLAN:   ? ? ?Cardiac risk counseling and prevention recommendations: ?-recommend heart healthy/Mediterranean diet, with whole grains, fruits, vegetable, fish, lean meats, nuts, and olive oil. Limit salt. ?-recommend moderate walking, 3-5 times/week for 30-50 minutes each session. Aim for at least 150 minutes.week. Goal should be pace of 3 miles/hours, or walking 1.5 miles in 30 minutes ?-recommend avoidance of tobacco products. Avoid excess alcohol. ?-ASCVD risk score: ?The 10-year ASCVD risk score (Arnett DK, et al., 2019) is: 0.3% ?  Values used to calculate the score: ?    Age: 28 years ?    Sex: Female ?    Is Non-Hispanic African American: No ?    Diabetic: No ?    Tobacco smoker: No ?    Systolic Blood Pressure: 761 mmHg ?    Is BP treated: No ?    HDL Cholesterol: 76 mg/dL ?    Total Cholesterol: 199 mg/dL   ? ?Plan for follow up: *** or sooner as needed. ? ?Buford Dresser, MD, PhD, [facility] ?[place]   ? ?Medication Adjustments/Labs and Tests Ordered: ?Current medicines are reviewed at length with the patient today.  Concerns regarding medicines are outlined above.  ? ?No orders of the defined types were placed in this encounter. ? ?No orders of the defined types were placed in this encounter. ? ?There are no Patient Instructions on file for this visit.  ? ?I,Mathew Stumpf,acting as a Education administrator for [employer], MD.,have documented all relevant documentation on the behalf of Buford Dresser, MD,as directed by  Buford Dresser, MD while in the presence  of Buford Dresser, MD. ? ?*** ? ?Signed, ?Buford Dresser, MD PhD ?07/28/2021 2:56 PM    ?[place] ? ?

## 2021-07-30 ENCOUNTER — Ambulatory Visit (HOSPITAL_BASED_OUTPATIENT_CLINIC_OR_DEPARTMENT_OTHER): Payer: 59 | Admitting: Cardiology

## 2021-08-22 ENCOUNTER — Encounter (HOSPITAL_BASED_OUTPATIENT_CLINIC_OR_DEPARTMENT_OTHER): Payer: Self-pay | Admitting: Cardiology

## 2021-08-22 ENCOUNTER — Ambulatory Visit (HOSPITAL_BASED_OUTPATIENT_CLINIC_OR_DEPARTMENT_OTHER): Payer: 59 | Admitting: Cardiology

## 2021-08-22 VITALS — BP 104/70 | HR 83 | Ht 62.0 in | Wt 145.9 lb

## 2021-08-22 DIAGNOSIS — Z7182 Exercise counseling: Secondary | ICD-10-CM

## 2021-08-22 DIAGNOSIS — Z8249 Family history of ischemic heart disease and other diseases of the circulatory system: Secondary | ICD-10-CM

## 2021-08-22 DIAGNOSIS — Z7189 Other specified counseling: Secondary | ICD-10-CM | POA: Diagnosis not present

## 2021-08-22 DIAGNOSIS — Z713 Dietary counseling and surveillance: Secondary | ICD-10-CM | POA: Diagnosis not present

## 2021-08-22 NOTE — Progress Notes (Signed)
?Cardiology Office Note:   ? ?Date:  08/22/2021  ? ?ID:  Carrie Morrison, DOB 09-30-1980, MRN [id number] ? ?PCP:  Pcp, No  ?Cardiologist:  Buford Dresser, MD ? ?Referring MD: Orma Render, NP  ? ?CC: new patient consultation for family history of premature CAD ? ?History of Present Illness:   ? ?Carrie Morrison is a 41 y.o. female with a hx of irritable bowel syndrome, fibroids who is seen as a new consult at the request of Early, Coralee Pesa, NP for the evaluation and management of family history of premature CAD. ? ?Note from 06/17/21 with Jacolyn Reedy, NP reviewed. Noted strong family history of heart disease. Referred to cardiology for further evaluation. ? ?Cardiovascular risk factors: ?Prior clinical ASCVD: none ?Comorbid conditions: hypertension, diabetes, chronic kidney disease. Was recommended for cholesterol medication about 10 years ago, changed lifestyle to avoid this.  ?Tobacco use history: former ?Family history: Father had first heart attack at age 84 and passed away at age 25, had additional MI/stents in interim. Paternal grandfather died at age 45 from heart attack. Paternal aunt had stroke age 21, MI at age 44, at which time she passed away. Another paternal aunt still living,no heart issues. Had a paternal cousin who died early 40s, cause unknown but heart suspected. Paternal grandmother lived to old age, had pacemaker. Mother age 23 had MI, no significant family history of heart issues known on rest of mother's side. ?Prior pertinent testing and/or incidental findings: had cardiac stress MRI in 2012 (care everywhere), no ischemia, scar.  ?Exercise level: she is a runner, notes that her heart rate reads average HR 173, can be 190 bpm. Runs an 8-9 minute mile, runs at most 6 miles at a time. No symptoms. Exercises 5 days/week. ?Current diet: tries to eat well, but gained about 20 lbs in the last 5 years. ? ?Cholesterol ?05/17/2010 Tchol 210, LDL 142, TG 50, HDL 68 ?06/17/2021 Tchol 199, LDL 109, TG 76,  HDL 76 ? ?Father was on cholesterol medication, but unclear what his numbers were. ? ?Denies chest pain, shortness of breath at rest or with normal exertion. No PND, orthopnea, LE edema or unexpected weight gain. No syncope or palpitations.  ? ?Past Medical History:  ?Diagnosis Date  ? GERD (gastroesophageal reflux disease)   ? Hyperlipemia   ? Pulmonary nodule, right   ? ? ?Past Surgical History:  ?Procedure Laterality Date  ? CESAREAN SECTION  11/04/09  ? [place]  ? RHINOPLASTY  2009  ? ? ?Current Medications: ?Current Outpatient Medications on File Prior to Visit  ?Medication Sig  ? EPINEPHrine 0.3 mg/0.3 mL IJ SOAJ injection Inject 0.3 mg into the muscle as needed.  ? ?No current facility-administered medications on file prior to visit.  ?  ? ?Allergies:   Fluconazole in dextrose and Latex  ? ?Social History  ? ?Tobacco Use  ? Smoking status: Former  ?  Types: Cigarettes  ?  Quit date: 12/23/2008  ?  Years since quitting: 12.6  ? Smokeless tobacco: Never  ?Substance Use Topics  ? Alcohol use: Yes  ?  Alcohol/week: 7.0 standard drinks  ?  Types: 7 Glasses of wine per week  ? Drug use: No  ? ? ?Family History: ?family history includes Asthma in her brother; Colon cancer in her maternal grandfather, maternal grandmother, and paternal grandmother; Diabetes in her paternal grandmother; Heart attack in her paternal grandfather; Heart attack (age of onset: 88) in her father; Heart attack (age of onset: 62)  in her mother; Heart disease in her father and mother; Hyperlipidemia in her father; Hypertension in her father; Stroke in her paternal grandmother. ? ?ROS:   ?Please see the history of present illness.  Additional pertinent ROS: ?Constitutional: Negative for chills, fever, night sweats, unintentional weight loss  ?HENT: Negative for ear pain and hearing loss.   ?Eyes: Negative for loss of vision and eye pain.  ?Respiratory: Negative for cough, sputum, wheezing.   ?Cardiovascular: See HPI. ?Gastrointestinal:  Negative for abdominal pain, melena, and hematochezia.  ?Genitourinary: Negative for dysuria and hematuria.  ?Musculoskeletal: Negative for falls and myalgias.  ?Skin: Negative for itching and rash.  ?Neurological: Negative for focal weakness, focal sensory changes and loss of consciousness.  ?Endo/Heme/Allergies: Does not bruise/bleed easily.   ? ? ?EKGs/Labs/Other Studies Reviewed:   ? ?The following studies were reviewed today: ?Echo 10/29/11 ?- Left ventricle: The cavity size was normal. Wall thickness  ?  was normal. Systolic function was normal. The estimated  ?  ejection fraction was in the range of 55% to 60%. Wall  ?  motion was normal; there were no regional wall motion  ?  abnormalities. Left ventricular diastolic function  ?  parameters were normal.  ?- Aortic valve: There was no stenosis.  ?- Mitral valve: No significant regurgitation.  ?- Right ventricle: The cavity size was normal. Systolic  ?  function was normal.  ?- Pulmonary arteries: PA peak pressure: 77m Hg (S).  ?- Inferior vena cava: The vessel was normal in size; the  ?  respirophasic diameter changes were in the normal range (=  ?  50%); findings are consistent with normal central venous  ?  pressure.  ?Impressions:  ? ?- Normal study. ? ?Cardiac stress MRI 09/24/2010 ?1.  No evidence of inducible ischemia during adenosine  ?stress.  ?2.  Normal resting left ventricular size and systolic  ?function with calculated ejection fraction 57%.  ?3.  No evidence of delayed myocardial hyperenhancement to  ?suggest scarring from prior myocardial infarction, active  ?inflammation or infiltrative disease.  ? ? ?EKG:  EKG is personally reviewed.   ?08/22/21: NSR at 83 bpm ? ?Recent Labs: ?06/17/2021: ALT 12; BUN 14; Creatinine, Ser 0.86; Hemoglobin 14.7; Platelets 366; Potassium 4.6; Sodium 141; TSH 2.660  ?Recent Lipid Panel ?   ?Component Value Date/Time  ? CHOL 199 06/17/2021 1646  ? TRIG 76 06/17/2021 1646  ? HDL 76 06/17/2021 1646  ? CHOLHDL 2.6  06/17/2021 1646  ? CHOLHDL 2.4 05/23/2015 1109  ? VLDL 16 05/23/2015 1109  ? LDLCALC 109 (H) 06/17/2021 1646  ? ? ?Physical Exam:   ? ?VS:  BP 104/70 (BP Location: Right Arm, Patient Position: Sitting)   Pulse 83   Ht '5\' 2"'$  (1.575 m)   Wt 145 lb 14.4 oz (66.2 kg)   BMI 26.69 kg/m?    ? ?Wt Readings from Last 3 Encounters:  ?08/22/21 145 lb 14.4 oz (66.2 kg)  ?06/17/21 144 lb (65.3 kg)  ?07/11/20 149 lb (67.6 kg)  ?  ?GEN: Well nourished, well developed in no acute distress ?HEENT: Normal, moist mucous membranes ?NECK: No JVD ?CARDIAC: regular rhythm, normal S1 and S2, no rubs or gallops. No murmur. ?VASCULAR: Radial and DP pulses 2+ bilaterally. No carotid bruits ?RESPIRATORY:  Clear to auscultation without rales, wheezing or rhonchi  ?ABDOMEN: Soft, non-tender, non-distended ?MUSCULOSKELETAL:  Ambulates independently ?SKIN: Warm and dry, no edema ?NEUROLOGIC:  Alert and oriented x 3. No focal neuro deficits noted. ?PSYCHIATRIC:  Normal affect   ? ?  ASSESSMENT:   ? ?1. Family history of premature coronary heart disease   ?2. Cardiac risk counseling   ?3. Counseling on health promotion and disease prevention   ?4. Nutritional counseling   ?5. Exercise counseling   ? ?PLAN:   ? ?Premature family history of CAD ?-discussed pathology of cholesterol, how plaques form, that MI/CVA result commonly from acute plaque rupture and not gradual stenosis. Discussed mechanism of statin to both decrease plaque accumulation and stabilize plaque that is already present. Discussed that calcium is a marker for plaque, with decades of validated data regarding average amounts of calcium for age/gender/ethnicity, as well as value of calcium score for risk stratification  ?-discussed lipoprotein(a) as well ?-after shared decision making, will proceed with both lp(a) and calcium score ?-she is amenable to statin if calcium score abnormal ? ?Cardiac risk counseling and prevention recommendations: ?-recommend heart healthy/Mediterranean  diet, with whole grains, fruits, vegetable, fish, lean meats, nuts, and olive oil. Limit salt. ?-recommend moderate walking, 3-5 times/week for 30-50 minutes each session. Aim for at least 150 minutes.week. Goal

## 2021-08-22 NOTE — Patient Instructions (Addendum)
Medication Instructions:  ?Your Physician recommend you continue on your current medication as directed.   ? ?*If you need a refill on your cardiac medications before your next appointment, please call your pharmacy* ? ? ?Lab Work: ?Your provider has recommended lab work today (LPa). Please have this collected at [facility] [place]. The lab is open 8:00 am - 4:30 pm. Please avoid 12:00p - 1:00p for lunch hour. You do not need an appointment. Please go to [address] [place], [place] [address]. This is in the Primary Care office on the 3rd floor, let them know you are there for blood work and they will direct you to the lab.  ?If you have labs (blood work) drawn today and your tests are completely normal, you will receive your results only by: ?MyChart Message (if you have MyChart) OR ?A paper copy in the mail ?If you have any lab test that is abnormal or we need to change your treatment, we will call you to review the results. ? ? ?Testing/Procedures: ?CT coronary calcium score.  ? ?Test locations: [facility] [place], [place] [address])  ? ?This is $99 out of pocket. ? ? ?Coronary CalciumScan ?A coronary calcium scan is an imaging test used to look for deposits of calcium and other fatty materials (plaques) in the inner lining of the blood vessels of the heart (coronary arteries). These deposits of calcium and plaques can partly clog and narrow the coronary arteries without producing any symptoms or warning signs. This puts a person at risk for a heart attack. This test can detect these deposits before symptoms develop. ?Tell a health care provider about: ?Any allergies you have. ?All medicines you are taking, including vitamins, herbs, eye drops, creams, and over-the-counter medicines. ?Any problems you or family members have had with anesthetic medicines. ?Any blood disorders you have. ?Any surgeries you have had. ?Any medical conditions you have. ?Whether  you are pregnant or may be pregnant. ?What are the risks? ?Generally, this is a safe procedure. However, problems may occur, including: ?Harm to a pregnant woman and her unborn baby. This test involves the use of radiation. Radiation exposure can be dangerous to a pregnant woman and her unborn baby. If you are pregnant, you generally should not have this procedure done. ?Slight increase in the risk of cancer. This is because of the radiation involved in the test. ?What happens before the procedure? ?No preparation is needed for this procedure. ?What happens during the procedure? ?You will undress and remove any jewelry around your neck or chest. ?You will put on a hospital gown. ?Sticky electrodes will be placed on your chest. The electrodes will be connected to an electrocardiogram (ECG) machine to record a tracing of the electrical activity of your heart. ?A CT scanner will take pictures of your heart. During this time, you will be asked to lie still and hold your breath for 2-3 seconds while a picture of your heart is being taken. ?The procedure may vary among health care providers and hospitals. ?What happens after the procedure? ?You can get dressed. ?You can return to your normal activities. ?It is up to you to get the results of your test. Ask your health care provider, or the department that is doing the test, when your results will be ready. ?Summary ?A coronary calcium scan is an imaging test used to look for deposits of calcium and other fatty materials (plaques) in the inner lining of the blood vessels of  the heart (coronary arteries). ?Generally, this is a safe procedure. Tell your health care provider if you are pregnant or may be pregnant. ?No preparation is needed for this procedure. ?A CT scanner will take pictures of your heart. ?You can return to your normal activities after the scan is done. ?This information is not intended to replace advice given to you by your health care provider. Make sure  you discuss any questions you have with your health care provider. ?Document Released: 10/17/2007 Document Revised: 03/09/2016 Document Reviewed: 03/09/2016 ?Elsevier Interactive Patient Education ? 2017 Elsevier Inc. ? ? ? ?Follow-Up: ?At [facility], you and your health needs are our priority.  As part of our continuing mission to provide you with exceptional heart care, we have created designated Provider Care Teams.  These Care Teams include your primary Cardiologist (physician) and Advanced Practice Providers (APPs -  Physician Assistants and Nurse Practitioners) who all work together to provide you with the care you need, when you need it. ? ?We recommend signing up for the patient portal called "MyChart".  Sign up information is provided on this After Visit Summary.  MyChart is used to connect with patients for Virtual Visits (Telemedicine).  Patients are able to view lab/test results, encounter notes, upcoming appointments, etc.  Non-urgent messages can be sent to your provider as well.   ?To learn more about what you can do with MyChart, go to [email or web address].   ? ?Your next appointment:   ?5 year(s) ? ?The format for your next appointment:   ?In Person ? ?Provider:   ?Buford Dresser, MD{ ? ? ?Important Information About Sugar ? ? ? ? ? ? ?

## 2021-08-23 LAB — LIPOPROTEIN A (LPA): Lipoprotein (a): 34.4 nmol/L (ref <75.0)

## 2021-09-05 ENCOUNTER — Telehealth (HOSPITAL_BASED_OUTPATIENT_CLINIC_OR_DEPARTMENT_OTHER): Payer: Self-pay

## 2021-09-05 NOTE — Telephone Encounter (Addendum)
Called results to patient and left results on VM (ok per DPR), instructions left to call office back if patient has any questions!  ? ? ? ?----- Message from Buford Dresser, MD sent at 08/23/2021  4:02 PM EDT ----- ?Lipoprotein a is normal, suggesting she does not carry a genetically elevate lp(a). Will wait on calcium score to make further recommendations ?

## 2021-09-10 ENCOUNTER — Ambulatory Visit (HOSPITAL_BASED_OUTPATIENT_CLINIC_OR_DEPARTMENT_OTHER): Payer: 59 | Admitting: Obstetrics & Gynecology

## 2021-09-10 ENCOUNTER — Encounter (HOSPITAL_BASED_OUTPATIENT_CLINIC_OR_DEPARTMENT_OTHER): Payer: Self-pay | Admitting: Obstetrics & Gynecology

## 2021-09-10 ENCOUNTER — Other Ambulatory Visit (HOSPITAL_COMMUNITY)
Admission: RE | Admit: 2021-09-10 | Discharge: 2021-09-10 | Disposition: A | Discharge status: Home / Self Care | Payer: 59 | Source: Ambulatory Visit | Attending: Obstetrics & Gynecology | Admitting: Obstetrics & Gynecology

## 2021-09-10 ENCOUNTER — Telehealth (HOSPITAL_BASED_OUTPATIENT_CLINIC_OR_DEPARTMENT_OTHER): Payer: Self-pay | Admitting: Obstetrics & Gynecology

## 2021-09-10 VITALS — BP 127/78 | HR 69 | Ht 63.25 in | Wt 148.0 lb

## 2021-09-10 DIAGNOSIS — Z01419 Encounter for gynecological examination (general) (routine) without abnormal findings: Secondary | ICD-10-CM

## 2021-09-10 DIAGNOSIS — Z124 Encounter for screening for malignant neoplasm of cervix: Secondary | ICD-10-CM | POA: Insufficient documentation

## 2021-09-10 DIAGNOSIS — N920 Excessive and frequent menstruation with regular cycle: Secondary | ICD-10-CM

## 2021-09-10 DIAGNOSIS — D219 Benign neoplasm of connective and other soft tissue, unspecified: Secondary | ICD-10-CM

## 2021-09-10 DIAGNOSIS — Z1231 Encounter for screening mammogram for malignant neoplasm of breast: Secondary | ICD-10-CM | POA: Diagnosis not present

## 2021-09-10 DIAGNOSIS — D259 Leiomyoma of uterus, unspecified: Secondary | ICD-10-CM

## 2021-09-10 NOTE — Telephone Encounter (Signed)
Called patient and left a message to call the office back about today's appointment that was at 915 am . ?

## 2021-09-10 NOTE — Progress Notes (Signed)
41 y.o. G3P0101 Married White or Caucasian female here for annual exam/new patient exam.  Used to see Dr. Phineas Real.  Has GI issues.  Has seen Dr. Loletha Carrow.  Did have ultrasound 07/2020 showing uterus measuring 11 x 8 x 8cm.  Has a central fibroid measuring almost 8cm.  Cycles are heavy.  Flow lasts 7 days.  It is longer than it used to be.  Three to four days are heavy.  She passes clots.  Recent hemoglobin was 14.7.  Has not had recent iron studies.   ? ?Patient's last menstrual period was 08/13/2021 (exact date).          ?Sexually active: Yes.    ?The current method of family planning is none.    ? [employer] - 09/10/21 0934   ? ?  ? Pregnancy Intention Screening  ? Does the patient want to become pregnant in the next year? No   ? Does the patient's partner want to become pregnant in the next year? No   ? Would the patient like to discuss contraceptive options today? No   ? ?  ?  ? ?  ? ?Exercising: yes, is a runner ?Smoker:  no ? ?Health Maintenance: ?Pap:  05/23/15 ?History of abnormal Pap:  no ?MMG:  order placed ?Colonoscopy:  guidelines reviewed ?Screening Labs: iron studies ? ? reports that she quit smoking about 12 years ago. Her smoking use included cigarettes. She has never used smokeless tobacco. She reports current alcohol use of about 7.0 standard drinks per week. She reports that she does not use drugs. ? ?Past Medical History:  ?Diagnosis Date  ? GERD (gastroesophageal reflux disease)   ? Hyperlipemia   ? Pulmonary nodule, right   ? ? ?Past Surgical History:  ?Procedure Laterality Date  ? CESAREAN SECTION  11/04/09  ? [place]  ? RHINOPLASTY  2009  ? ? ?Current Outpatient Medications  ?Medication Sig Dispense Refill  ? EPINEPHrine 0.3 mg/0.3 mL IJ SOAJ injection Inject 0.3 mg into the muscle as needed. 2 each 1  ? ?No current facility-administered medications for this visit.  ? ? ?Family History  ?Problem Relation Age of Onset  ? Heart attack Paternal Grandfather   ? Colon cancer Paternal  Grandmother   ? Diabetes Paternal Grandmother   ? Stroke Paternal Grandmother   ? Colon cancer Maternal Grandmother   ? Colon cancer Maternal Grandfather   ? Heart disease Father   ? Hypertension Father   ? Hyperlipidemia Father   ? Heart attack Father 53  ? Heart disease Mother   ? Heart attack Mother 55  ? Asthma Brother   ? ? ?Pertinent items are noted in HPI. ? ?Exam:   ?BP 127/78   Pulse 69   Ht 5' 3.25" (1.607 m)   Wt 148 lb (67.1 kg)   LMP 08/13/2021 (Exact Date)   BMI 26.01 kg/m?   Height: 5' 3.25" (160.7 cm) ? ?General appearance: alert, cooperative and appears stated age ?Head: Normocephalic, without obvious abnormality, atraumatic ?Neck: no adenopathy, supple, symmetrical, trachea midline and thyroid normal to inspection and palpation ?Lungs: clear to auscultation bilaterally ?Breasts: normal appearance, no masses or tenderness ?Heart: regular rate and rhythm ?Abdomen: soft, non-tender; bowel sounds normal; no masses,  no organomegaly ?Extremities: extremities normal, atraumatic, no cyanosis or edema ?Skin: Skin color, texture, turgor normal. No rashes or lesions ?Lymph nodes: Cervical, supraclavicular, and axillary nodes normal. ?No abnormal inguinal nodes palpated ?Neurologic: Grossly normal ? ? ?Pelvic: External genitalia:  no lesions ?             Urethra:  normal appearing urethra with no masses, tenderness or lesions ?             Bartholins and Skenes: normal    ?             Vagina: normal appearing vagina with normal color and no discharge, no lesions ?             Cervix: no lesions ?             Pap taken: Yes.   ?Bimanual Exam:  Uterus:  enlarged, 16 weeks size ?             Adnexa: no mass, fullness, tenderness ?              Rectovaginal: Confirms ?              Anus:  normal sphincter tone, no lesions ? ?Chaperone, Octaviano Batty, CMA, was present for exam. ? ?Assessment/Plan: ?1. Well woman exam with routine gynecological exam ?- pap and HR HPV obtained today ?- MMG order placed ? ?2.  Cervical cancer screening ?- Cytology - PAP( [place]) ? ?3. Menorrhagia with regular cycle ?- Iron, TIBC and Ferritin Panel ? ?4. Encounter for screening mammogram for malignant neoplasm of breast ?- MM 3D SCREEN BREAST BILATERAL; Future ? ?5. Fibroids ?- treatment options discussed including [place], hysterectomy, myomectomy.  Procedures, recovery, risks all reviewed.  Information given for pt to consider options.   ? ?Total time with pt was 50 minutes, ~20 minutes of this time specifically spent discussing fibroids, last ultrasound, treatments.   ? ?

## 2021-09-11 LAB — IRON,TIBC AND FERRITIN PANEL
Ferritin: 72 ng/mL (ref 15–150)
Iron Saturation: 43 % (ref 15–55)
Iron: 140 ug/dL (ref 27–159)
Total Iron Binding Capacity: 322 ug/dL (ref 250–450)
UIBC: 182 ug/dL (ref 131–425)

## 2021-09-12 LAB — CYTOLOGY - PAP
Comment: NEGATIVE
Diagnosis: NEGATIVE
High risk HPV: NEGATIVE

## 2021-09-25 ENCOUNTER — Telehealth (HOSPITAL_BASED_OUTPATIENT_CLINIC_OR_DEPARTMENT_OTHER): Payer: Self-pay | Admitting: Cardiology

## 2021-09-25 NOTE — Telephone Encounter (Signed)
Left message for patient to call and discuss scheduling the CT Calcium scoring ordered by Dr. Harrell Gave

## 2021-10-13 ENCOUNTER — Encounter (HOSPITAL_BASED_OUTPATIENT_CLINIC_OR_DEPARTMENT_OTHER): Payer: Self-pay | Admitting: Cardiology

## 2021-10-13 ENCOUNTER — Telehealth (HOSPITAL_BASED_OUTPATIENT_CLINIC_OR_DEPARTMENT_OTHER): Payer: Self-pay | Admitting: Cardiology

## 2021-10-13 NOTE — Telephone Encounter (Signed)
Left message for patient to call and discuss rescheduling the Calcium scoring ordered by Dr. Cinda Quest mail letter requesting patient call to schedule

## 2021-11-11 ENCOUNTER — Telehealth (HOSPITAL_BASED_OUTPATIENT_CLINIC_OR_DEPARTMENT_OTHER): Payer: Self-pay | Admitting: Obstetrics & Gynecology

## 2021-11-11 NOTE — Telephone Encounter (Signed)
Patient called and would like to go head schedule her surgery to have a hysterectomy .

## 2021-11-11 NOTE — Telephone Encounter (Signed)
Pt requesting that surgery be scheduled as soon as possible. Advised that appt may be needed to discuss the surgery. Appt made. Advised that I would send her request to Dr. Sabra Heck.

## 2021-11-21 ENCOUNTER — Telehealth (HOSPITAL_BASED_OUTPATIENT_CLINIC_OR_DEPARTMENT_OTHER): Payer: Self-pay | Admitting: Cardiology

## 2021-11-21 NOTE — Telephone Encounter (Signed)
Patient was called 09/26/21--09/26/21--10/03/21--10/06/21-- to discuss scheduling the Cardiac Calcium scoring ordered by Dr. Harrell Gave.  Letter was mailed 10/13/21 requesting patient call--no response.  Will cancel the order at this time.

## 2021-12-02 ENCOUNTER — Ambulatory Visit (INDEPENDENT_AMBULATORY_CARE_PROVIDER_SITE_OTHER): Payer: 59 | Admitting: Obstetrics & Gynecology

## 2021-12-02 ENCOUNTER — Encounter (HOSPITAL_BASED_OUTPATIENT_CLINIC_OR_DEPARTMENT_OTHER): Payer: Self-pay | Admitting: Obstetrics & Gynecology

## 2021-12-02 VITALS — BP 123/76 | HR 61 | Ht 62.0 in | Wt 151.4 lb

## 2021-12-02 DIAGNOSIS — N920 Excessive and frequent menstruation with regular cycle: Secondary | ICD-10-CM

## 2021-12-02 DIAGNOSIS — N852 Hypertrophy of uterus: Secondary | ICD-10-CM | POA: Diagnosis not present

## 2021-12-03 ENCOUNTER — Encounter (HOSPITAL_BASED_OUTPATIENT_CLINIC_OR_DEPARTMENT_OTHER): Payer: Self-pay

## 2021-12-03 ENCOUNTER — Telehealth: Payer: Self-pay

## 2021-12-03 ENCOUNTER — Encounter (HOSPITAL_BASED_OUTPATIENT_CLINIC_OR_DEPARTMENT_OTHER): Payer: Self-pay | Admitting: Obstetrics & Gynecology

## 2021-12-03 LAB — CBC
Hemoglobin: 9.8 g/dL — ABNORMAL LOW (ref 11.1–15.9)
Platelets: 413 10*3/uL (ref 150–450)
WBC: 8.2 10*3/uL (ref 3.4–10.8)

## 2021-12-03 NOTE — Progress Notes (Signed)
GYNECOLOGY  VISIT  CC:   discuss surger  HPI: 41 y.o. G62P0101 Married White or Caucasian female here for discussion of definitive treatment for fibroid uterus.  Has long hx of fibroids and associated bleeding issues.  Has just really gotten tired of symptoms and ready to proceed with hysterectomy.  Discussed laparoscopic vs open procedures.  Right now, feel would attempt laparoscopic.  Risks reviewed.  Pt does have 16 week size uterus on physical exam from earlier this year.  Last ultrasound was 2022.  Feel should update this to assess size of uterus and also make sure she does not have any anemia prior to surgery if possible.  Typically, she does not have anemia or iron deficiency issues.  H/o preterm delivery with only pregnancy and had a cesarean section.  Did really well from surgical standpoint.     Past Medical History:  Diagnosis Date   GERD (gastroesophageal reflux disease)    Hyperlipemia    Pulmonary nodule, right     MEDS:   Current Outpatient Medications on File Prior to Visit  Medication Sig Dispense Refill   EPINEPHrine 0.3 mg/0.3 mL IJ SOAJ injection Inject 0.3 mg into the muscle as needed. 2 each 1   No current facility-administered medications on file prior to visit.    ALLERGIES: Fluconazole in dextrose and Latex  SH:  former smoking hx, married  Review of Systems  Constitutional: Negative.     PHYSICAL EXAMINATION:    BP 123/76 (BP Location: Left Arm, Patient Position: Sitting, Cuff Size: Normal)   Pulse 61   Ht '5\' 2"'$  (1.575 m) Comment: reported  Wt 151 lb 6.4 oz (68.7 kg)   LMP 11/09/2021 (Approximate)   BMI 27.69 kg/m     General appearance: alert, cooperative and appears stated age No other exam performed today  Assessment/Plan: 1. Menorrhagia with regular cycle - CBC - US PELVIS TRANSVAGINAL NON-OB (TV ONLY); Future  2. Enlarged uterus

## 2021-12-03 NOTE — Telephone Encounter (Signed)
Called patient to discuss potential surgery date, no answer left voicemail explaining she will be scheduled on Dr. Ammie Ferrier first available on 09/27.

## 2021-12-04 ENCOUNTER — Telehealth (HOSPITAL_BASED_OUTPATIENT_CLINIC_OR_DEPARTMENT_OTHER): Payer: Self-pay | Admitting: *Deleted

## 2021-12-04 NOTE — Telephone Encounter (Signed)
Spoke with pt to let her know that surgery had been scheduled but is subject to change, as Dr. Sabra Heck needs a certain provider to assist. Pt also inquires about bloodwork. Advised that a differential had been added on to CBC and we would notify her of results once received.

## 2021-12-05 LAB — CBC WITH DIFFERENTIAL/PLATELET

## 2021-12-05 LAB — SPECIMEN STATUS REPORT

## 2021-12-15 NOTE — Addendum Note (Signed)
Addended by: Megan Salon on: 12/15/2021 07:21 AM   Modules accepted: Orders

## 2021-12-17 ENCOUNTER — Ambulatory Visit (INDEPENDENT_AMBULATORY_CARE_PROVIDER_SITE_OTHER): Payer: 59

## 2021-12-17 ENCOUNTER — Ambulatory Visit (INDEPENDENT_AMBULATORY_CARE_PROVIDER_SITE_OTHER): Payer: 59 | Admitting: Obstetrics & Gynecology

## 2021-12-17 ENCOUNTER — Encounter (HOSPITAL_BASED_OUTPATIENT_CLINIC_OR_DEPARTMENT_OTHER): Payer: Self-pay | Admitting: Obstetrics & Gynecology

## 2021-12-17 VITALS — BP 114/73 | HR 60 | Ht 62.0 in | Wt 150.8 lb

## 2021-12-17 DIAGNOSIS — D259 Leiomyoma of uterus, unspecified: Secondary | ICD-10-CM | POA: Diagnosis not present

## 2021-12-17 DIAGNOSIS — N920 Excessive and frequent menstruation with regular cycle: Secondary | ICD-10-CM

## 2021-12-17 DIAGNOSIS — D251 Intramural leiomyoma of uterus: Secondary | ICD-10-CM

## 2021-12-17 DIAGNOSIS — D500 Iron deficiency anemia secondary to blood loss (chronic): Secondary | ICD-10-CM | POA: Diagnosis not present

## 2021-12-17 DIAGNOSIS — D252 Subserosal leiomyoma of uterus: Secondary | ICD-10-CM

## 2021-12-17 DIAGNOSIS — D509 Iron deficiency anemia, unspecified: Secondary | ICD-10-CM | POA: Insufficient documentation

## 2021-12-18 ENCOUNTER — Other Ambulatory Visit: Payer: Self-pay | Admitting: Pharmacy Technician

## 2021-12-18 LAB — IRON,TIBC AND FERRITIN PANEL
Ferritin: 60 ng/mL (ref 15–150)
Iron Saturation: 38 % (ref 15–55)
Iron: 122 ug/dL (ref 27–159)
Total Iron Binding Capacity: 324 ug/dL (ref 250–450)
UIBC: 202 ug/dL (ref 131–425)

## 2021-12-18 LAB — SPECIMEN STATUS REPORT

## 2021-12-22 ENCOUNTER — Telehealth (HOSPITAL_BASED_OUTPATIENT_CLINIC_OR_DEPARTMENT_OTHER): Payer: Self-pay | Admitting: *Deleted

## 2021-12-22 NOTE — Telephone Encounter (Signed)
Patient called and stated she returning nurse Maudie Mercury call.

## 2021-12-22 NOTE — Progress Notes (Unsigned)
GYNECOLOGY  VISIT  CC:   follow up after ultrasound  HPI: 41 y.o. G21P0101 Married White or Caucasian female here for discussion of most recent lab work.  Hemoglobin was 9.8.  She is on daily iron.  Iron studies will be obtained today.  Discussed with pt possible IV iron.  She would prefer not to do this if possible.  Will be faithful with IV iron.  Advised would prefer hb to closer to normal if possible due to fibroid uterus.    Ultrasound today with uterus 12 x 11 x 8cm with multiple fibroids but largest is 9 x 10 x 11cm and anterior/fundal location.  It has enlarged about 3cm since prior examination.  Endometrium 1.68m.  Ovaries normal.   Pt is ready to proceed with surgery at this point.  Has trip planned over Thanksgiving and would like to be able to go on this if possible.     Past Medical History:  Diagnosis Date   GERD (gastroesophageal reflux disease)    Hyperlipemia    Pulmonary nodule, right     MEDS:   Current Outpatient Medications on File Prior to Visit  Medication Sig Dispense Refill   EPINEPHrine 0.3 mg/0.3 mL IJ SOAJ injection Inject 0.3 mg into the muscle as needed. 2 each 1   No current facility-administered medications on file prior to visit.    ALLERGIES: Fluconazole in dextrose and Latex  SH:  married, non smoker  Review of Systems  Constitutional: Negative.   Genitourinary:        Pelvic/abdominal discomfort    PHYSICAL EXAMINATION:    BP 114/73 (BP Location: Right Arm, Patient Position: Sitting, Cuff Size: Large)   Pulse 60   Ht '5\' 2"'$  (1.575 m) Comment: reported  Wt 150 lb 12.8 oz (68.4 kg)   LMP 11/09/2021 (Approximate)   BMI 27.58 kg/m     General appearance: alert, cooperative and appears stated age Abdomen: soft, non-tender; bowel sounds normal; fibroid uterus palpable at about 14-16 weeks,  no organomegaly Lymph:  no inguinal LAD noted  Pelvic: External genitalia:  no lesions              Urethra:  normal appearing urethra with no masses,  tenderness or lesions              Bartholins and Skenes: normal                 Vagina: normal appearing vagina with normal color and discharge, no lesions              Cervix: no lesions              Bimanual Exam:  Uterus:  enlarged, 16 weeks size but mobile              Adnexa: no mass, fullness, tenderness              Chaperone, TOctaviano Batty CMA, was present for exam.  Assessment/Plan: 1. Iron deficiency anemia due to chronic blood loss - pt will take daily iron with Vit D and plan to recheck 1 month.  If no significant improvement, may need to proceed with IV iron  2. Menorrhagia with regular cycle - Iron, TIBC and Ferritin Panel  3.  Fibroid uterus - will proceed with surgical planning

## 2022-01-08 ENCOUNTER — Telehealth (HOSPITAL_BASED_OUTPATIENT_CLINIC_OR_DEPARTMENT_OTHER): Payer: Self-pay | Admitting: Obstetrics & Gynecology

## 2022-01-08 NOTE — Telephone Encounter (Signed)
Patient called and would like for someone to please call her because she not aware that she was still having surgery .Patient stated she she had ask for it to be changed.

## 2022-01-12 NOTE — Telephone Encounter (Signed)
Called pt to let her know that Dr. Sabra Heck is actively working to get her surgery date changed. Advised that we would give her a call back once change has been made.

## 2022-01-20 ENCOUNTER — Encounter (HOSPITAL_BASED_OUTPATIENT_CLINIC_OR_DEPARTMENT_OTHER): Payer: Self-pay | Admitting: Obstetrics & Gynecology

## 2022-01-20 ENCOUNTER — Other Ambulatory Visit: Payer: Self-pay

## 2022-01-20 NOTE — Progress Notes (Addendum)
Spoke w/ via phone for pre-op interview---[facility] needs dos----urine pregnancy per anesthesia, surgeon orders pending as of 01/20/22.               Lab results------02/04/22 lab appt for cbc, type & screen, 08/22/21 EKG in chart & Epic COVID test -----patient states asymptomatic no test needed Arrive at -------0530 on Monday, 02/09/22 NPO after MN NO Solid Food.  Clear liquids from MN until---0430 Med rec completed Medications to take morning of surgery -----NONE Diabetic medication -----n/a Patient instructed no nail polish to be worn day of surgery Patient instructed to bring photo id and insurance card day of surgery Patient aware to have Driver (ride ) / caregiver    for 24 hours after surgery - husband, Legrand Como Patient Special Instructions -----Extended / overnight stay instructions given. Pre-Op special Istructions -----Requested orders via Epic IB from Dr. Sabra Heck on 01/20/22. Patient verbalized understanding of instructions that were given at this phone interview. Patient denies shortness of breath, chest pain, fever, cough at this phone interview.

## 2022-01-20 NOTE — Progress Notes (Signed)
Your procedure is scheduled on Monday, 02/09/22.  Report to [facility].   Call this number if you have problems the morning of surgery  :[phone].   OUR ADDRESS IS [place].  WE ARE LOCATED IN [facility].  PLEASE BRING YOUR INSURANCE CARD AND PHOTO ID DAY OF SURGERY.  ONLY 2 PEOPLE ARE ALLOWED IN  WAITING  ROOM.                                      REMEMBER:  DO NOT EAT FOOD, CANDY GUM OR MINTS  AFTER MIDNIGHT THE NIGHT BEFORE YOUR SURGERY . YOU MAY HAVE CLEAR LIQUIDS FROM MIDNIGHT THE NIGHT BEFORE YOUR SURGERY UNTIL  4:30 AM. NO CLEAR LIQUIDS AFTER   4:30 AM DAY OF SURGERY.  YOU MAY  BRUSH YOUR TEETH MORNING OF SURGERY AND RINSE YOUR MOUTH OUT, NO CHEWING GUM CANDY OR MINTS.     CLEAR LIQUID DIET   Foods Allowed                                                                     Foods Excluded  Coffee and tea, regular and decaf                             liquids that you cannot  Plain Jell-O                                                                   see through such as: Fruit ices (not with fruit pulp)                                     milk, soups, orange juice  Plain  Popsicles                                    All solid food Carbonated beverages, regular and diet                                    Cranberry, grape and apple juices Sports drinks like Gatorade _____________________________________________________________________     TAKE THESE MEDICATIONS MORNING OF SURGERY: NONE    UP TO 4 VISITORS  MAY VISIT IN THE EXTENDED RECOVERY ROOM UNTIL 800 PM ONLY.  ONE  VISITOR AGE 42 AND OVER MAY SPEND THE NIGHT AND MUST BE IN EXTENDED RECOVERY ROOM NO LATER THAN 800 PM . YOUR DISCHARGE TIME AFTER YOU SPEND THE NIGHT IS 900 AM THE MORNING AFTER YOUR SURGERY.  YOU MAY PACK A SMALL OVERNIGHT BAG WITH TOILETRIES FOR YOUR OVERNIGHT STAY IF  YOU WISH.  YOUR PRESCRIPTION MEDICATIONS WILL BE PROVIDED DURING  [place].                                      DO NOT WEAR JEWERLY, MAKE UP. DO NOT WEAR LOTIONS, POWDERS, PERFUMES OR NAIL POLISH ON YOUR FINGERNAILS. TOENAIL POLISH IS OK TO WEAR. DO NOT SHAVE FOR 48 HOURS PRIOR TO DAY OF SURGERY. MEN MAY SHAVE FACE AND NECK. CONTACTS, GLASSES, OR DENTURES MAY NOT BE WORN TO SURGERY.  REMEMBER: NO SMOKING, DRUGS OR ALCOHOL FOR 24 HOURS BEFORE YOUR SURGERY.                                    [facility] IS NOT RESPONSIBLE  FOR ANY BELONGINGS.                                                                    Marland Kitchen           [facility] - Preparing for Surgery Before surgery, you can play an important role.  Because skin is not sterile, your skin needs to be as free of germs as possible.  You can reduce the number of germs on your skin by washing with CHG (chlorahexidine gluconate) soap before surgery.  CHG is an antiseptic cleaner which kills germs and bonds with the skin to continue killing germs even after washing. Please DO NOT use if you have an allergy to CHG or antibacterial soaps.  If your skin becomes reddened/irritated stop using the CHG and inform your nurse when you arrive at Short Stay. Do not shave (including legs and underarms) for at least 48 hours prior to the first CHG shower.  You may shave your face/neck. Please follow these instructions carefully:  1.  Shower with CHG Soap the night before surgery and the  morning of Surgery.  2.  If you choose to wash your hair, wash your hair first as usual with your  normal  shampoo.  3.  After you shampoo, rinse your hair and body thoroughly to remove the  shampoo.                            4.  Use CHG as you would any other liquid soap.  You can apply chg directly  to the skin and wash , please wash your belly button thoroughly with chg soap provided night before and morning of your surgery.                     Gently with a scrungie or clean washcloth.  5.  Apply the CHG Soap to your body  ONLY FROM THE NECK DOWN.   Do not use on face/ open                           Wound or open sores. Avoid contact with eyes, ears mouth and genitals (private parts).  Wash face,  Genitals (private parts) with your normal soap.             6.  Wash thoroughly, paying special attention to the area where your surgery  will be performed.  7.  Thoroughly rinse your body with warm water from the neck down.  8.  DO NOT shower/wash with your normal soap after using and rinsing off  the CHG Soap.                9.  Pat yourself dry with a clean towel.            10.  Wear clean pajamas.            11.  Place clean sheets on your bed the night of your first shower and do not  sleep with pets. Day of Surgery : Do not apply any lotions/deodorants the morning of surgery.  Please wear clean clothes to the hospital/surgery center.  IF YOU HAVE ANY SKIN IRRITATION OR PROBLEMS WITH THE SURGICAL SOAP, PLEASE GET A BAR OF GOLD DIAL SOAP AND SHOWER THE NIGHT BEFORE YOUR SURGERY AND THE MORNING OF YOUR SURGERY. PLEASE LET THE NURSE KNOW MORNING OF YOUR SURGERY IF YOU HAD ANY PROBLEMS WITH THE SURGICAL SOAP.   ________________________________________________________________________                                                        QUESTIONS [facility] PRE OP NURSE PHONE [phone].

## 2022-01-23 ENCOUNTER — Other Ambulatory Visit (HOSPITAL_BASED_OUTPATIENT_CLINIC_OR_DEPARTMENT_OTHER): Payer: 59

## 2022-01-23 ENCOUNTER — Encounter: Payer: Self-pay | Admitting: Obstetrics & Gynecology

## 2022-01-23 DIAGNOSIS — N920 Excessive and frequent menstruation with regular cycle: Secondary | ICD-10-CM

## 2022-01-23 LAB — CBC
Hematocrit: 42.6 % (ref 34.0–46.6)
Hemoglobin: 14.3 g/dL (ref 11.1–15.9)
MCH: 31.9 pg (ref 26.6–33.0)
MCHC: 33.6 g/dL (ref 31.5–35.7)
MCV: 95 fL (ref 79–97)
Platelets: 355 10*3/uL (ref 150–450)
RBC: 4.48 x10E6/uL (ref 3.77–5.28)
RDW: 11 % — ABNORMAL LOW (ref 11.7–15.4)
WBC: 5.6 10*3/uL (ref 3.4–10.8)

## 2022-01-28 ENCOUNTER — Other Ambulatory Visit (HOSPITAL_BASED_OUTPATIENT_CLINIC_OR_DEPARTMENT_OTHER): Payer: Self-pay | Admitting: Obstetrics & Gynecology

## 2022-01-28 DIAGNOSIS — D251 Intramural leiomyoma of uterus: Secondary | ICD-10-CM

## 2022-01-28 DIAGNOSIS — N920 Excessive and frequent menstruation with regular cycle: Secondary | ICD-10-CM

## 2022-01-28 DIAGNOSIS — Z01818 Encounter for other preprocedural examination: Secondary | ICD-10-CM

## 2022-02-03 ENCOUNTER — Ambulatory Visit (INDEPENDENT_AMBULATORY_CARE_PROVIDER_SITE_OTHER): Payer: 59 | Admitting: Obstetrics & Gynecology

## 2022-02-03 ENCOUNTER — Encounter (HOSPITAL_BASED_OUTPATIENT_CLINIC_OR_DEPARTMENT_OTHER): Payer: Self-pay | Admitting: Obstetrics & Gynecology

## 2022-02-03 VITALS — BP 135/102 | HR 86 | Ht 62.0 in | Wt 149.8 lb

## 2022-02-03 DIAGNOSIS — D219 Benign neoplasm of connective and other soft tissue, unspecified: Secondary | ICD-10-CM | POA: Diagnosis not present

## 2022-02-03 DIAGNOSIS — D500 Iron deficiency anemia secondary to blood loss (chronic): Secondary | ICD-10-CM

## 2022-02-03 DIAGNOSIS — K588 Other irritable bowel syndrome: Secondary | ICD-10-CM | POA: Diagnosis not present

## 2022-02-03 NOTE — Progress Notes (Signed)
41 y.o. G17P0101 Married White or Caucasian female here for discussion of upcoming procedure.  TLH/bilateral salpingectomy/possible oophorectomy, cystoscopy planned due to enlarged fibroid uterus and h/o iron deficiency anemia.  Pt has been on oral iron with improvement in hemoglobin.  Pt's symptoms are mostly related to bulk/size of uterus.  Pre-op evaluation thus far has included ultrasound on 8/16 showing uterus measuring 12 x 22 x 8cm with multiple fibroids and largest measuring 11cm.  She did have an endometrial biopsy in 2017.  Although flow is heavier, it hasn't really changed and it is the size of the uterus that is most bothersome to her.  We have discussed alternatives including [place] and pt has decided to proceed with definitive surgery.  Procedure discussed with patient.  Hospital stay, recovery and pain management all discussed.  Risks discussed including but not limited to bleeding, 1% risk of receiving a  transfusion, infection, 3-4% risk of bowel/bladder/ureteral/vascular injury discussed as well as possible need for additional surgery if injury does occur discussed.  DVT/PE and rare risk of death discussed.  My actual complications with prior surgeries discussed.  Vaginal cuff dehiscence discussed.  Hernia formation discussed.  Positioning and incision locations discussed.  Patient aware if pathology abnormal she may need additional treatment.  All questions answered.     Ob Hx:   Patient's last menstrual period was 01/08/2022 (approximate).          Sexually active: Yes.   Birth control: no method Last pap: 09/10/21 Last MMG: has been ordered Smoking: No   Past Surgical History:  Procedure Laterality Date   CESAREAN SECTION  11/04/09   MOUTH SURGERY  1995   RHINOPLASTY  2009    Past Medical History:  Diagnosis Date   Chest pain    hx of chest pain in 2012, per MD note it was thought to be musculoskeletal, 09/24/10 normal Cardiovascular stress MRI, 10/29/11 Echocardiogram EF 55 -60%    Family history of premature CAD    strong family hx of premature CAD, father had heart attack at age 34 and mother at age 51, [place] w/ cardiologist Dr. Buford Dresser on 08/22/21 in [place].   GERD (gastroesophageal reflux disease)    History of kidney stones    about 20 years ago per pt as of 01/20/22   Hyperlipemia    Iron deficiency anemia due to chronic blood loss 2023   Follows with Dr. Hale Bogus, OB-GYN. Hysterectomy is scheduled for 02/09/22.   Pulmonary nodule, right    stable, no monitoring needed per pt    Allergies: Fluconazole in dextrose and Latex  Current Outpatient Medications  Medication Sig Dispense Refill   EPINEPHrine 0.3 mg/0.3 mL IJ SOAJ injection Inject 0.3 mg into the muscle as needed. 2 each 1   Multiple Vitamin (MULTIVITAMIN WITH MINERALS) TABS tablet Take 1 tablet by mouth daily.     TURMERIC CURCUMIN PO Take by mouth.     No current facility-administered medications for this visit.    ROS: Pertinent items noted in HPI and remainder of comprehensive ROS otherwise negative.  Exam:   BP (!) 135/102 (BP Location: Right Arm, Patient Position: Sitting, Cuff Size: Normal)   Pulse 86   Ht '5\' 2"'$  (1.575 m) Comment: Reported  Wt 149 lb 12.8 oz (67.9 kg)   LMP 01/08/2022 (Approximate)   BMI 27.40 kg/m   General appearance: alert and cooperative Head: Normocephalic, without obvious abnormality, atraumatic Lungs: normal respirations Heart: regular rate and rhythm Extremities: extremities normal, atraumatic, no  cyanosis or edema Skin: Skin color, texture, turgor normal. No rashes or lesions Neurologic: Grossly normal   Assessment/Plan: 1. Fibroids - TLH/bilateral salpingectomy/possible BSO, cystoscopy planned. - Pre and post op instructions reviewed - Post op pain medications reviewed  2. Iron deficiency anemia due to chronic blood loss - continuing her iron for now but Hb is normal  3. Other irritable bowel syndrome

## 2022-02-04 ENCOUNTER — Encounter (HOSPITAL_COMMUNITY)
Admission: RE | Admit: 2022-02-04 | Discharge: 2022-02-04 | Disposition: A | Discharge status: Home / Self Care | Payer: 59 | Source: Ambulatory Visit | Attending: Obstetrics & Gynecology | Admitting: Obstetrics & Gynecology

## 2022-02-04 DIAGNOSIS — D251 Intramural leiomyoma of uterus: Secondary | ICD-10-CM | POA: Diagnosis not present

## 2022-02-04 DIAGNOSIS — Z01818 Encounter for other preprocedural examination: Secondary | ICD-10-CM

## 2022-02-04 DIAGNOSIS — N920 Excessive and frequent menstruation with regular cycle: Secondary | ICD-10-CM | POA: Insufficient documentation

## 2022-02-04 DIAGNOSIS — Z01812 Encounter for preprocedural laboratory examination: Secondary | ICD-10-CM | POA: Diagnosis present

## 2022-02-04 DIAGNOSIS — D252 Subserosal leiomyoma of uterus: Secondary | ICD-10-CM | POA: Diagnosis not present

## 2022-02-04 LAB — CBC
HCT: 45.8 % (ref 36.0–46.0)
Hemoglobin: 15.8 g/dL — ABNORMAL HIGH (ref 12.0–15.0)
MCH: 34.3 pg — ABNORMAL HIGH (ref 26.0–34.0)
MCHC: 34.5 g/dL (ref 30.0–36.0)
MCV: 99.3 fL (ref 80.0–100.0)
Platelets: 380 10*3/uL (ref 150–400)
RBC: 4.61 MIL/uL (ref 3.87–5.11)
RDW: 11.6 % (ref 11.5–15.5)
WBC: 5.6 10*3/uL (ref 4.0–10.5)
nRBC: 0 % (ref 0.0–0.2)

## 2022-02-08 NOTE — Anesthesia Preprocedure Evaluation (Signed)
Anesthesia Evaluation  Patient identified by MRN, date of birth, ID band Patient awake    Reviewed: Allergy & Precautions, H&P , NPO status , Patient's Chart, lab work & pertinent test results  Airway Mallampati: II  TM Distance: >3 FB Neck ROM: Full    Dental no notable dental hx. (+) Teeth Intact, Dental Advisory Given   Pulmonary neg pulmonary ROS, former smoker,    Pulmonary exam normal breath sounds clear to auscultation       Cardiovascular Exercise Tolerance: Good negative cardio ROS   Rhythm:Regular Rate:Normal     Neuro/Psych negative neurological ROS  negative psych ROS   GI/Hepatic Neg liver ROS, GERD  ,  Endo/Other  negative endocrine ROS  Renal/GU negative Renal ROS  negative genitourinary   Musculoskeletal   Abdominal   Peds  Hematology  (+) Blood dyscrasia, anemia ,   Anesthesia Other Findings   Reproductive/Obstetrics negative OB ROS                            Anesthesia Physical Anesthesia Plan  ASA: 2  Anesthesia Plan: General   Post-op Pain Management: Tylenol PO (pre-op)* and Toradol IV (intra-op)*   Induction: Intravenous  PONV Risk Score and Plan: 4 or greater and Ondansetron, Dexamethasone, Midazolam and Scopolamine patch - Pre-op  Airway Management Planned: Oral ETT  Additional Equipment:   Intra-op Plan:   Post-operative Plan: Extubation in OR  Informed Consent: I have reviewed the patients History and Physical, chart, labs and discussed the procedure including the risks, benefits and alternatives for the proposed anesthesia with the patient or authorized representative who has indicated his/her understanding and acceptance.     Dental advisory given  Plan Discussed with: CRNA  Anesthesia Plan Comments:       Anesthesia Quick Evaluation

## 2022-02-09 ENCOUNTER — Other Ambulatory Visit: Payer: Self-pay

## 2022-02-09 ENCOUNTER — Encounter (HOSPITAL_BASED_OUTPATIENT_CLINIC_OR_DEPARTMENT_OTHER)
Admission: RE | Disposition: A | Discharge status: Home / Self Care | Payer: Self-pay | Source: Ambulatory Visit | Attending: Obstetrics & Gynecology

## 2022-02-09 ENCOUNTER — Encounter (HOSPITAL_BASED_OUTPATIENT_CLINIC_OR_DEPARTMENT_OTHER): Payer: Self-pay | Admitting: Obstetrics & Gynecology

## 2022-02-09 ENCOUNTER — Ambulatory Visit (HOSPITAL_BASED_OUTPATIENT_CLINIC_OR_DEPARTMENT_OTHER): Payer: 59 | Admitting: Anesthesiology

## 2022-02-09 ENCOUNTER — Other Ambulatory Visit (HOSPITAL_COMMUNITY): Payer: Self-pay

## 2022-02-09 ENCOUNTER — Ambulatory Visit (HOSPITAL_COMMUNITY)
Admission: RE | Admit: 2022-02-09 | Discharge: 2022-02-09 | Disposition: A | Discharge status: Home / Self Care | Payer: 59 | Source: Ambulatory Visit | Attending: Obstetrics & Gynecology | Admitting: Obstetrics & Gynecology

## 2022-02-09 ENCOUNTER — Other Ambulatory Visit (HOSPITAL_BASED_OUTPATIENT_CLINIC_OR_DEPARTMENT_OTHER): Payer: Self-pay | Admitting: Obstetrics & Gynecology

## 2022-02-09 ENCOUNTER — Encounter: Payer: Self-pay | Admitting: Obstetrics & Gynecology

## 2022-02-09 DIAGNOSIS — D252 Subserosal leiomyoma of uterus: Secondary | ICD-10-CM

## 2022-02-09 DIAGNOSIS — D259 Leiomyoma of uterus, unspecified: Secondary | ICD-10-CM | POA: Diagnosis present

## 2022-02-09 DIAGNOSIS — D500 Iron deficiency anemia secondary to blood loss (chronic): Secondary | ICD-10-CM | POA: Diagnosis not present

## 2022-02-09 DIAGNOSIS — Z01818 Encounter for other preprocedural examination: Secondary | ICD-10-CM

## 2022-02-09 DIAGNOSIS — N72 Inflammatory disease of cervix uteri: Secondary | ICD-10-CM | POA: Insufficient documentation

## 2022-02-09 DIAGNOSIS — D251 Intramural leiomyoma of uterus: Secondary | ICD-10-CM | POA: Diagnosis not present

## 2022-02-09 DIAGNOSIS — Z87891 Personal history of nicotine dependence: Secondary | ICD-10-CM | POA: Insufficient documentation

## 2022-02-09 DIAGNOSIS — N920 Excessive and frequent menstruation with regular cycle: Secondary | ICD-10-CM | POA: Diagnosis not present

## 2022-02-09 DIAGNOSIS — N879 Dysplasia of cervix uteri, unspecified: Secondary | ICD-10-CM | POA: Insufficient documentation

## 2022-02-09 HISTORY — DX: Personal history of urinary calculi: Z87.442

## 2022-02-09 HISTORY — PX: TOTAL LAPAROSCOPIC HYSTERECTOMY WITH SALPINGECTOMY: SHX6742

## 2022-02-09 HISTORY — DX: Chest pain, unspecified: R07.9

## 2022-02-09 HISTORY — PX: CYSTOSCOPY: SHX5120

## 2022-02-09 HISTORY — DX: Family history of ischemic heart disease and other diseases of the circulatory system: Z82.49

## 2022-02-09 LAB — HEMOGLOBIN: Hemoglobin: 13.5 g/dL (ref 12.0–15.0)

## 2022-02-09 LAB — POCT PREGNANCY, URINE: Preg Test, Ur: NEGATIVE

## 2022-02-09 LAB — TYPE AND SCREEN
ABO/RH(D): O POS
Antibody Screen: NEGATIVE

## 2022-02-09 SURGERY — HYSTERECTOMY, TOTAL, LAPAROSCOPIC, WITH SALPINGECTOMY
Anesthesia: General | Site: Urethra

## 2022-02-09 MED ORDER — MENTHOL 3 MG MT LOZG: 1.0000 | LOZENGE | OROMUCOSAL | Status: DC | PRN

## 2022-02-09 MED ORDER — PROPOFOL 10 MG/ML IV BOLUS
INTRAVENOUS | Status: DC | PRN
  Administered 2022-02-09: 20 mg via INTRAVENOUS
  Administered 2022-02-09: 130 mg via INTRAVENOUS
  Administered 2022-02-09: 20 mg via INTRAVENOUS

## 2022-02-09 MED ORDER — LIDOCAINE 2% (20 MG/ML) 5 ML SYRINGE
INTRAMUSCULAR | Status: DC | PRN
  Administered 2022-02-09: 60 mg via INTRAVENOUS

## 2022-02-09 MED ORDER — 0.9 % SODIUM CHLORIDE (POUR BTL) OPTIME
TOPICAL | Status: DC | PRN
  Administered 2022-02-09: 500 mL

## 2022-02-09 MED ORDER — SODIUM CHLORIDE 0.9 % IV SOLN
INTRAVENOUS | Status: AC | PRN
  Administered 2022-02-09 (×2): 1000 mL

## 2022-02-09 MED ORDER — KETAMINE HCL 10 MG/ML IJ SOLN
INTRAMUSCULAR | Status: DC | PRN
  Administered 2022-02-09: 30 mg via INTRAVENOUS

## 2022-02-09 MED ORDER — GABAPENTIN 100 MG PO CAPS
100.0000 mg | ORAL_CAPSULE | Freq: Three times a day (TID) | ORAL | Status: DC
  Administered 2022-02-09: 100 mg via ORAL

## 2022-02-09 MED ORDER — ONDANSETRON HCL 4 MG/2ML IJ SOLN
INTRAMUSCULAR | Status: DC | PRN
  Administered 2022-02-09: 4 mg via INTRAVENOUS

## 2022-02-09 MED ORDER — KETOROLAC TROMETHAMINE 30 MG/ML IJ SOLN
INTRAMUSCULAR | Status: AC
  Filled 2022-02-09: qty 1

## 2022-02-09 MED ORDER — LACTATED RINGERS IV SOLN: INTRAVENOUS | Status: DC

## 2022-02-09 MED ORDER — HEMOSTATIC AGENTS (NO CHARGE) OPTIME
TOPICAL | Status: DC | PRN
  Administered 2022-02-09: 1 via TOPICAL

## 2022-02-09 MED ORDER — ACETAMINOPHEN 500 MG PO TABS
1000.0000 mg | ORAL_TABLET | ORAL | Status: AC
  Administered 2022-02-09: 1000 mg via ORAL

## 2022-02-09 MED ORDER — ONDANSETRON HCL 4 MG/2ML IJ SOLN: 4.0000 mg | Freq: Four times a day (QID) | INTRAMUSCULAR | Status: DC | PRN

## 2022-02-09 MED ORDER — SUGAMMADEX SODIUM 200 MG/2ML IV SOLN
INTRAVENOUS | Status: DC | PRN
  Administered 2022-02-09: 150 mg via INTRAVENOUS

## 2022-02-09 MED ORDER — KETAMINE HCL 50 MG/5ML IJ SOSY
PREFILLED_SYRINGE | INTRAMUSCULAR | Status: AC
  Filled 2022-02-09: qty 5

## 2022-02-09 MED ORDER — HYDROMORPHONE HCL 1 MG/ML IJ SOLN
0.2500 mg | INTRAMUSCULAR | Status: DC | PRN
  Administered 2022-02-09 (×2): 0.25 mg via INTRAVENOUS

## 2022-02-09 MED ORDER — IBUPROFEN 800 MG PO TABS
800.0000 mg | ORAL_TABLET | Freq: Three times a day (TID) | ORAL | 0 refills | Status: DC | PRN
  Filled 2022-02-09: qty 30, 10d supply, fill #0

## 2022-02-09 MED ORDER — OXYCODONE-ACETAMINOPHEN 5-325 MG PO TABS: 1.0000 | ORAL_TABLET | Freq: Four times a day (QID) | ORAL | 0 refills | Status: DC | PRN

## 2022-02-09 MED ORDER — MORPHINE SULFATE (PF) 4 MG/ML IV SOLN: 1.0000 mg | INTRAVENOUS | Status: DC | PRN

## 2022-02-09 MED ORDER — PROPOFOL 10 MG/ML IV BOLUS
INTRAVENOUS | Status: AC
  Filled 2022-02-09: qty 20

## 2022-02-09 MED ORDER — ARTIFICIAL TEARS OPHTHALMIC OINT
TOPICAL_OINTMENT | OPHTHALMIC | Status: AC
  Filled 2022-02-09: qty 3.5

## 2022-02-09 MED ORDER — OXYCODONE-ACETAMINOPHEN 5-325 MG PO TABS
1.0000 | ORAL_TABLET | Freq: Four times a day (QID) | ORAL | 0 refills | Status: AC | PRN
  Filled 2022-02-09: qty 30, 4d supply, fill #0

## 2022-02-09 MED ORDER — SODIUM CHLORIDE 0.9 % IV SOLN
2.0000 g | INTRAVENOUS | Status: AC
  Administered 2022-02-09: 2 g via INTRAVENOUS

## 2022-02-09 MED ORDER — ROCURONIUM BROMIDE 10 MG/ML (PF) SYRINGE
PREFILLED_SYRINGE | INTRAVENOUS | Status: AC
  Filled 2022-02-09: qty 10

## 2022-02-09 MED ORDER — ACETAMINOPHEN 500 MG PO TABS
ORAL_TABLET | ORAL | Status: AC
  Filled 2022-02-09: qty 2

## 2022-02-09 MED ORDER — HYDROMORPHONE HCL 1 MG/ML IJ SOLN
INTRAMUSCULAR | Status: AC
  Filled 2022-02-09: qty 1

## 2022-02-09 MED ORDER — ROCURONIUM BROMIDE 10 MG/ML (PF) SYRINGE
PREFILLED_SYRINGE | INTRAVENOUS | Status: DC | PRN
  Administered 2022-02-09: 20 mg via INTRAVENOUS
  Administered 2022-02-09: 10 mg via INTRAVENOUS
  Administered 2022-02-09: 50 mg via INTRAVENOUS
  Administered 2022-02-09: 10 mg via INTRAVENOUS
  Administered 2022-02-09: 20 mg via INTRAVENOUS

## 2022-02-09 MED ORDER — OXYCODONE-ACETAMINOPHEN 5-325 MG PO TABS: 2.0000 | ORAL_TABLET | ORAL | 0 refills | Status: DC | PRN

## 2022-02-09 MED ORDER — ONDANSETRON HCL 4 MG/2ML IJ SOLN
INTRAMUSCULAR | Status: AC
  Filled 2022-02-09: qty 2

## 2022-02-09 MED ORDER — IBUPROFEN 200 MG PO TABS: 600.0000 mg | ORAL_TABLET | Freq: Four times a day (QID) | ORAL | Status: DC

## 2022-02-09 MED ORDER — KETOROLAC TROMETHAMINE 30 MG/ML IJ SOLN
30.0000 mg | Freq: Four times a day (QID) | INTRAMUSCULAR | Status: DC
  Administered 2022-02-09: 30 mg via INTRAVENOUS

## 2022-02-09 MED ORDER — MIDAZOLAM HCL 2 MG/2ML IJ SOLN
INTRAMUSCULAR | Status: AC
  Filled 2022-02-09: qty 2

## 2022-02-09 MED ORDER — ONDANSETRON HCL 4 MG PO TABS: 4.0000 mg | ORAL_TABLET | Freq: Four times a day (QID) | ORAL | Status: DC | PRN

## 2022-02-09 MED ORDER — HYDROMORPHONE HCL 1 MG/ML IJ SOLN
INTRAMUSCULAR | Status: DC | PRN
  Administered 2022-02-09: .5 mg via INTRAVENOUS

## 2022-02-09 MED ORDER — GABAPENTIN 100 MG PO CAPS
ORAL_CAPSULE | ORAL | Status: AC
  Filled 2022-02-09: qty 1

## 2022-02-09 MED ORDER — OXYCODONE-ACETAMINOPHEN 5-325 MG PO TABS: 1.0000 | ORAL_TABLET | ORAL | Status: DC | PRN

## 2022-02-09 MED ORDER — SCOPOLAMINE 1 MG/3DAYS TD PT72
1.0000 | MEDICATED_PATCH | TRANSDERMAL | Status: DC
  Administered 2022-02-09: 1.5 mg via TRANSDERMAL

## 2022-02-09 MED ORDER — MIDAZOLAM HCL 2 MG/2ML IJ SOLN
INTRAMUSCULAR | Status: DC | PRN
  Administered 2022-02-09 (×2): 1 mg via INTRAVENOUS

## 2022-02-09 MED ORDER — FENTANYL CITRATE (PF) 100 MCG/2ML IJ SOLN
INTRAMUSCULAR | Status: DC | PRN
  Administered 2022-02-09: 100 ug via INTRAVENOUS
  Administered 2022-02-09 (×3): 50 ug via INTRAVENOUS

## 2022-02-09 MED ORDER — SODIUM CHLORIDE 0.9 % IV SOLN
INTRAVENOUS | Status: DC | PRN
  Administered 2022-02-09: 60 mL

## 2022-02-09 MED ORDER — LIDOCAINE HCL (PF) 2 % IJ SOLN
INTRAMUSCULAR | Status: AC
  Filled 2022-02-09: qty 5

## 2022-02-09 MED ORDER — SCOPOLAMINE 1 MG/3DAYS TD PT72
MEDICATED_PATCH | TRANSDERMAL | Status: AC
  Filled 2022-02-09: qty 1

## 2022-02-09 MED ORDER — SIMETHICONE 80 MG PO CHEW: 80.0000 mg | CHEWABLE_TABLET | Freq: Four times a day (QID) | ORAL | Status: DC | PRN

## 2022-02-09 MED ORDER — FENTANYL CITRATE (PF) 250 MCG/5ML IJ SOLN
INTRAMUSCULAR | Status: AC
  Filled 2022-02-09: qty 5

## 2022-02-09 MED ORDER — BUPIVACAINE HCL (PF) 0.25 % IJ SOLN
INTRAMUSCULAR | Status: DC | PRN
  Administered 2022-02-09: 10 mL

## 2022-02-09 MED ORDER — ENOXAPARIN SODIUM 40 MG/0.4ML IJ SOSY
40.0000 mg | PREFILLED_SYRINGE | INTRAMUSCULAR | Status: AC
  Administered 2022-02-09: 40 mg via SUBCUTANEOUS

## 2022-02-09 MED ORDER — ALUM & MAG HYDROXIDE-SIMETH 200-200-20 MG/5ML PO SUSP: 30.0000 mL | ORAL | Status: DC | PRN

## 2022-02-09 MED ORDER — KETOROLAC TROMETHAMINE 30 MG/ML IJ SOLN: 30.0000 mg | Freq: Once | INTRAMUSCULAR | Status: DC

## 2022-02-09 MED ORDER — DEXAMETHASONE SODIUM PHOSPHATE 10 MG/ML IJ SOLN
INTRAMUSCULAR | Status: DC | PRN
  Administered 2022-02-09: 10 mg via INTRAVENOUS

## 2022-02-09 MED ORDER — DEXAMETHASONE SODIUM PHOSPHATE 10 MG/ML IJ SOLN
INTRAMUSCULAR | Status: AC
  Filled 2022-02-09: qty 1

## 2022-02-09 MED ORDER — DEXTROSE-NACL 5-0.45 % IV SOLN: INTRAVENOUS | Status: DC

## 2022-02-09 MED ORDER — PANTOPRAZOLE SODIUM 40 MG IV SOLR: 40.0000 mg | Freq: Every day | INTRAVENOUS | Status: DC

## 2022-02-09 MED ORDER — KETOROLAC TROMETHAMINE 30 MG/ML IJ SOLN
INTRAMUSCULAR | Status: DC | PRN
  Administered 2022-02-09: 30 mg via INTRAVENOUS

## 2022-02-09 MED ORDER — HYDROMORPHONE HCL 2 MG/ML IJ SOLN
INTRAMUSCULAR | Status: AC
  Filled 2022-02-09: qty 1

## 2022-02-09 MED ORDER — SODIUM CHLORIDE 0.9 % IV SOLN
INTRAVENOUS | Status: AC
  Filled 2022-02-09: qty 2

## 2022-02-09 MED ORDER — ENOXAPARIN SODIUM 40 MG/0.4ML IJ SOSY
PREFILLED_SYRINGE | INTRAMUSCULAR | Status: AC
  Filled 2022-02-09: qty 0.4

## 2022-02-09 MED ORDER — POVIDONE-IODINE 10 % EX SWAB: 2.0000 | Freq: Once | CUTANEOUS | Status: DC

## 2022-02-09 SURGICAL SUPPLY — 63 items
ADH SKN CLS APL DERMABOND .7 (GAUZE/BANDAGES/DRESSINGS) ×3
APL SRG 38 LTWT LNG FL B (MISCELLANEOUS) ×3
APPLICATOR ARISTA FLEXITIP XL (MISCELLANEOUS) ×1 IMPLANT
BAG DECANTER FOR FLEXI CONT (MISCELLANEOUS) ×3 IMPLANT
BLADE SURG 10 STRL SS (BLADE) ×5 IMPLANT
CELL SAVER LIPIGURD (MISCELLANEOUS) ×3 IMPLANT
CNTNR URN SCR LID CUP LEK RST (MISCELLANEOUS) ×1 IMPLANT
CONT SPEC 4OZ STRL OR WHT (MISCELLANEOUS) ×3
COVER BACK TABLE 60X90IN (DRAPES) ×3 IMPLANT
COVER MAYO STAND STRL (DRAPES) ×3 IMPLANT
DERMABOND ADVANCED .7 DNX12 (GAUZE/BANDAGES/DRESSINGS) ×3 IMPLANT
DEVICE RETRIEVAL ALEXIS 14 (MISCELLANEOUS) IMPLANT
DURAPREP 26ML APPLICATOR (WOUND CARE) ×3 IMPLANT
EXTRT SYSTEM ALEXIS 14CM (MISCELLANEOUS) ×3
GAUZE 4X4 16PLY ~~LOC~~+RFID DBL (SPONGE) ×6 IMPLANT
GLOVE BIOGEL PI IND STRL 6.5 (GLOVE) ×5 IMPLANT
GLOVE BIOGEL PI IND STRL 7.0 (GLOVE) ×14 IMPLANT
GLOVE BIOGEL PI IND STRL 7.5 (GLOVE) ×1 IMPLANT
GLOVE ECLIPSE 6.5 STRL STRAW (GLOVE) ×6 IMPLANT
GLOVE SURG SS PI 6.5 STRL IVOR (GLOVE) ×5 IMPLANT
GLOVE SURG SS PI 7.5 STRL IVOR (GLOVE) ×4 IMPLANT
GOWN STRL REUS W/ TWL LRG LVL3 (GOWN DISPOSABLE) ×4 IMPLANT
GOWN STRL REUS W/TWL LRG LVL3 (GOWN DISPOSABLE) ×12
GOWN STRL REUS W/TWL XL LVL3 (GOWN DISPOSABLE) ×8 IMPLANT
HEMOSTAT ARISTA ABSORB 3G PWDR (HEMOSTASIS) ×1 IMPLANT
KIT TURNOVER CYSTO (KITS) ×3 IMPLANT
LEGGING LITHOTOMY PAIR STRL (DRAPES) ×3 IMPLANT
LIGASURE VESSEL 5MM BLUNT TIP (ELECTROSURGICAL) ×3 IMPLANT
NDL INSUFFLATION 14GA 120MM (NEEDLE) ×2 IMPLANT
NDL SAFETY ECLIP 18X1.5 (MISCELLANEOUS) ×1 IMPLANT
NEEDLE INSUFFLATION 14GA 120MM (NEEDLE) ×3 IMPLANT
NS IRRIG 500ML POUR BTL (IV SOLUTION) ×1 IMPLANT
OCCLUDER COLPOPNEUMO (BALLOONS) ×3 IMPLANT
PACK LAPAROSCOPY BASIN (CUSTOM PROCEDURE TRAY) ×3 IMPLANT
PACK TRENDGUARD 450 HYBRID PRO (MISCELLANEOUS) ×3 IMPLANT
PAD OB MATERNITY 4.3X12.25 (PERSONAL CARE ITEMS) ×3 IMPLANT
POUCH LAPAROSCOPIC INSTRUMENT (MISCELLANEOUS) ×3 IMPLANT
PROTECTOR NERVE ULNAR (MISCELLANEOUS) ×6 IMPLANT
SCISSORS LAP 5X35 DISP (ENDOMECHANICALS) IMPLANT
SET IRRIG Y TYPE TUR BLADDER L (SET/KITS/TRAYS/PACK) ×3 IMPLANT
SET SUCTION IRRIG HYDROSURG (IRRIGATION / IRRIGATOR) ×3 IMPLANT
SET TRI-LUMEN FLTR TB AIRSEAL (TUBING) ×3 IMPLANT
SHEARS HARMONIC ACE PLUS 36CM (ENDOMECHANICALS) ×3 IMPLANT
SPIKE FLUID TRANSFER (MISCELLANEOUS) ×2 IMPLANT
SUT VIC AB 0 CT1 27 (SUTURE) ×6
SUT VIC AB 0 CT1 27XBRD ANBCTR (SUTURE) ×6 IMPLANT
SUT VIC AB 4-0 PS2 18 (SUTURE) ×3 IMPLANT
SUT VLOC 180 0 9IN  GS21 (SUTURE) ×3
SUT VLOC 180 0 9IN GS21 (SUTURE) ×3 IMPLANT
SYR 10ML LL (SYRINGE) ×3 IMPLANT
SYR 50ML LL SCALE MARK (SYRINGE) ×6 IMPLANT
SYR 5ML LL (SYRINGE) ×1 IMPLANT
TIP RUMI ORANGE 6.7MMX12CM (TIP) ×1 IMPLANT
TIP UTERINE 6.7X10CM GRN DISP (MISCELLANEOUS) ×1 IMPLANT
TOWEL OR 17X26 10 PK STRL BLUE (TOWEL DISPOSABLE) ×5 IMPLANT
TRAY FOLEY W/BAG SLVR 14FR LF (SET/KITS/TRAYS/PACK) ×3 IMPLANT
TRENDGUARD 450 HYBRID PRO PACK (MISCELLANEOUS) ×3
TROCAR ADV FIXATION 5X100MM (TROCAR) ×4 IMPLANT
TROCAR PORT AIRSEAL 5X120 (TROCAR) ×3 IMPLANT
TROCAR Z-THREAD FIOS 5X100MM (TROCAR) ×3 IMPLANT
TUBE CONNECTING 12X1/4 (SUCTIONS) ×1 IMPLANT
WARMER LAPAROSCOPE (MISCELLANEOUS) ×3 IMPLANT
YANKAUER SUCT BULB TIP NO VENT (SUCTIONS) ×1 IMPLANT

## 2022-02-09 NOTE — Progress Notes (Signed)
Dr Sabra Heck at bedside to evaluate patient for discharge.

## 2022-02-09 NOTE — Transfer of Care (Signed)
Immediate Anesthesia Transfer of Care Note  Patient: Carrie Morrison  Procedure(s) Performed: TOTAL LAPAROSCOPIC HYSTERECTOMY WITH SALPINGECTOMY (Bilateral: Abdomen) CYSTOSCOPY (Urethra)  Patient Location: PACU  Anesthesia Type:General  Level of Consciousness: awake, alert  and oriented  Airway & Oxygen Therapy: Patient Spontanous Breathing  Post-op Assessment: Report given to RN and Post -op Vital signs reviewed and stable  Post vital signs: Reviewed and stable  Last Vitals:  Vitals Value Taken Time  BP 104/62 02/09/22 1115  Temp    Pulse 72 02/09/22 1119  Resp 14 02/09/22 1119  SpO2 93 % 02/09/22 1119  Vitals shown include unvalidated device data.  Last Pain:  Vitals:   02/09/22 0619  TempSrc: Oral  PainSc: 0-No pain      Patients Stated Pain Goal: 6 (44/69/50 7225)  Complications: No notable events documented.

## 2022-02-09 NOTE — Discharge Instructions (Addendum)

## 2022-02-09 NOTE — H&P (Signed)
Carrie Morrison is an 41 y.o. female G2P2 MWF with hx of enlarged fibroid uterus causing primarily bulk symptoms related to the fibroids.  With imaging, her uterus and fibroids have enlarged in the last year and half now with her uterus measuring 12 x 11 x 8 with large 11cm fibroid.  Total volume is 531.  Ovaries appeared normal.  She has had some iron deficiency anemia as well but this resolved with oral iron.  Given change in size of uterus and discomfort, options for treatment have been discussed including myomectomy, [place], and hysterectomy.  She has decided to proceed with definitive management.  Risks, benefits and alteratives have all been dicussed.  Pertinent Gynecological History: Menses:  regular and heavy at times Contraception:  none DES exposure: denies Blood transfusions: none Sexually transmitted diseases: no past history Previous GYN Procedures:  NSVD and cesarean section x 1   Last mammogram: has been ordered Last pap: normal Date: 09/10/2021 OB History: G2, P2   Menstrual History: Patient's last menstrual period was 01/08/2022 (approximate).    Past Medical History:  Diagnosis Date   Chest pain    hx of chest pain in 2012, per MD note it was thought to be musculoskeletal, 09/24/10 normal Cardiovascular stress MRI, 10/29/11 Echocardiogram EF 55 -60%   Family history of premature CAD    strong family hx of premature CAD, father had heart attack at age 70 and mother at age 73, [place] w/ cardiologist Dr. Buford Dresser on 08/22/21 in [place].   GERD (gastroesophageal reflux disease)    History of kidney stones    about 20 years ago per pt as of 01/20/22   Hyperlipemia    Iron deficiency anemia due to chronic blood loss 2023   Follows with Dr. Hale Bogus, OB-GYN. Hysterectomy is scheduled for 02/09/22.   Pulmonary nodule, right    stable, no monitoring needed per pt    Past Surgical History:  Procedure Laterality Date   CESAREAN SECTION  11/04/09   MOUTH SURGERY  1995    RHINOPLASTY  2009    Family History  Problem Relation Age of Onset   Heart attack Paternal Grandfather    Colon cancer Paternal Grandmother    Diabetes Paternal Grandmother    Stroke Paternal Grandmother    Colon cancer Maternal Grandmother    Colon cancer Maternal Grandfather    Heart disease Father    Hypertension Father    Hyperlipidemia Father    Heart attack Father 35   Heart disease Mother    Heart attack Mother 25   Asthma Brother     Social History:  reports that she quit smoking about 13 years ago. Her smoking use included cigarettes. She has never used smokeless tobacco. She reports current alcohol use of about 5.0 standard drinks of alcohol per week. She reports that she does not use drugs.  Allergies:  Allergies  Allergen Reactions   Fluconazole In Dextrose Other (See Comments)    Tongue /throat swelled up   Latex Anaphylaxis, Hives, Shortness Of Breath and Itching    Tongue and throat swelling    Medications Prior to Admission  Medication Sig Dispense Refill Last Dose   Multiple Vitamin (MULTIVITAMIN WITH MINERALS) TABS tablet Take 1 tablet by mouth daily.   Past Week   TURMERIC CURCUMIN PO Take by mouth.   02/06/2022   EPINEPHrine 0.3 mg/0.3 mL IJ SOAJ injection Inject 0.3 mg into the muscle as needed. 2 each 1 Unknown    Review of Systems  All other systems reviewed and are negative.   Blood pressure (!) 136/95, pulse 88, temperature 98 F (36.7 C), temperature source Oral, resp. rate 18, height '5\' 2"'$  (1.575 m), weight 67.2 kg, last menstrual period 01/08/2022, SpO2 99 %. Physical Exam Constitutional:      Appearance: Normal appearance.  Cardiovascular:     Rate and Rhythm: Normal rate and regular rhythm.  Pulmonary:     Effort: Pulmonary effort is normal.     Breath sounds: Normal breath sounds.  Skin:    General: Skin is warm.  Neurological:     General: No focal deficit present.     Mental Status: She is alert.  Psychiatric:        Mood and  Affect: Mood normal.     Results for orders placed or performed during the hospital encounter of 02/09/22 (from the past 24 hour(s))  Pregnancy, urine POC     Status: None   Collection Time: 02/09/22  6:02 AM  Result Value Ref Range   Preg Test, Ur NEGATIVE NEGATIVE    No results found.  Assessment/Plan: 41 yo G2P2 MWF with enlarged fibroid uterus here for definitive treatment with TLH/bilateral salpingectomy, possible oophorectomy and cystoscopy.  Questions answered this morning.  Pt ready to proceed.

## 2022-02-09 NOTE — Progress Notes (Signed)
Day of Surgery Procedure(s) (LRB): TOTAL LAPAROSCOPIC HYSTERECTOMY WITH SALPINGECTOMY (Bilateral) CYSTOSCOPY (N/A)  Subjective: Patient reports feeling good.  Denies nausea.  Has good pain control.  Voiding well.  Walking.    Objective: I have reviewed patient's vital signs, intake and output, medications, and labs.  General: alert and no distress Resp: clear to auscultation bilaterally Cardio: regular rate and rhythm, S1, S2 normal, no murmur, click, rub or gallop GI: soft, non-tender; bowel sounds normal; no masses,  no organomegaly and incision: clean, dry, and intact Extremities: extremities normal, atraumatic, no cyanosis or edema Vaginal Bleeding: minimal  Assessment: s/p Procedure(s): TOTAL LAPAROSCOPIC HYSTERECTOMY WITH SALPINGECTOMY (Bilateral) CYSTOSCOPY (N/A): stable and progressing well  Plan: Discharge home  LOS: 0 days    Megan Salon, MD 02/09/2022, 7:00 PM

## 2022-02-09 NOTE — Anesthesia Postprocedure Evaluation (Signed)
Anesthesia Post Note  Patient: Carrie Morrison  Procedure(s) Performed: TOTAL LAPAROSCOPIC HYSTERECTOMY WITH SALPINGECTOMY (Bilateral: Abdomen) CYSTOSCOPY (Urethra)     Patient location during evaluation: PACU Anesthesia Type: General Level of consciousness: awake and alert Pain management: pain level controlled Vital Signs Assessment: post-procedure vital signs reviewed and stable Respiratory status: spontaneous breathing, nonlabored ventilation and respiratory function stable Cardiovascular status: blood pressure returned to baseline and stable Postop Assessment: no apparent nausea or vomiting Anesthetic complications: no   No notable events documented.  Last Vitals:  Vitals:   02/09/22 1145 02/09/22 1200  BP: 112/67 104/68  Pulse: 71 66  Resp: 16 20  Temp:  36.4 C  SpO2: 95% 96%    Last Pain:  Vitals:   02/09/22 1200  TempSrc:   PainSc: 4                  Keatyn Luck,W. EDMOND

## 2022-02-09 NOTE — Anesthesia Procedure Notes (Signed)
Procedure Name: Intubation Date/Time: 02/09/2022 7:52 AM  Performed by: Mechele Claude, CRNAPre-anesthesia Checklist: Patient identified, Emergency Drugs available, Suction available and Patient being monitored Patient Re-evaluated:Patient Re-evaluated prior to induction Oxygen Delivery Method: Circle system utilized Preoxygenation: Pre-oxygenation with 100% oxygen Induction Type: IV induction Ventilation: Mask ventilation without difficulty Laryngoscope Size: Mac and 3 Tube type: Oral Tube size: 7.0 mm Number of attempts: 1 Airway Equipment and Method: Stylet and Oral airway Placement Confirmation: ETT inserted through vocal cords under direct vision, positive ETCO2 and breath sounds checked- equal and bilateral Secured at: 22 cm Tube secured with: Tape Dental Injury: Teeth and Oropharynx as per pre-operative assessment

## 2022-02-09 NOTE — Op Note (Addendum)
02/09/2022  11:06 AM  PATIENT:  Carrie Morrison  41 y.o. female  PRE-OPERATIVE DIAGNOSIS:  Menorrhagia Fibroids  POST-OPERATIVE DIAGNOSIS:  Menorrhagia Fibroids  PROCEDURE:  Procedure(s): TOTAL LAPAROSCOPIC HYSTERECTOMY WITH SALPINGECTOMY CYSTOSCOPY  SURGEON:  Megan Salon  ASSISTANTS: Sumner Boast, MD.  An experienced assistant was required given the standard of surgical care given the complexity of the case.  This assistant was needed for exposure, dissection, suctioning, retraction, instrument exchange and for overall help during the procedure.  RNFA help was also unavailable.  ANESTHESIA:   general  ESTIMATED BLOOD LOSS: 25 mL  BLOOD ADMINISTERED:none   FLUIDS: 1200 cc LR  UOP: 250cc clear  SPECIMEN:  uterus (in pieces as was morcellated in specimen bag), cervix and bilateral fallopian tubes  DISPOSITION OF SPECIMEN:  PATHOLOGY  FINDINGS: large fibroid uterus, filled the pelvis, mobile, normal ovarian and normal fallopian tubes, normal upper abdomen  DESCRIPTION OF OPERATION: Patient is taken to the operating room. She is placed in the supine position. She is a running IV in place. Informed consent was present on the chart. SCDs on her lower extremities and functioning properly. Patient was positioned while she was awake.  Her legs were placed in the low lithotomy position in [place]. Her arms were tucked by the side.  General endotracheal anesthesia was administered by the anesthesia staff without difficulty. Dr. Ola Spurr, anesthesia, oversaw case.  Time out performed.    Clora prep was then used to prep the abdomen and Hibiclens was used to prep the inner thighs, perineum and vagina. Once 3 minutes had past the patient was draped in a normal standard fashion. The legs were lifted to the high lithotomy position. The cervix was visualized by placing a heavy weighted speculum in the posterior aspect of the vagina and using a curved Deaver retractor to the retract  anteriorly. The anterior lip of the cervix was grasped with single-tooth tenaculum.  The cervix sounded to 11 cm. Pratt dilators were used to dilate the cervix up to a #21. A RUMI uterine manipulator was obtained. A #10 disposable tip was placed on the RUMI manipulator as well as a 3.5, silver KOH ring. This was passed through the cervix and the bulb of the disposable tip was inflated with 10 cc of normal saline. There was a good fit of the KOH ring around the cervix. The tenaculum was removed. There is also good manipulation of the uterus. The speculum and retractor were removed as well. A Foley catheter was placed to straight drain.  Clear urine was noted. Legs were lowered to the low lithotomy position and attention was turned the abdomen.  The umbilicus was everted.  Marcaine 0.25% used to anesthetize the skin.  Using #11 blade, 1m skin incision was made.  A Veress needle was obtained. Syringe of sterile saline was placed on a open Veress needle.  With the abdomen elevated, the Veress needle was passed into the umbilicus until the pop was heard and then fluid started to drip.  Then low flow CO2 gas was attached the needle and the pneumoperitoneum was achieved without difficulty. Once four liters of gas was in the abdomen the Veress needle was removed and a 5 millimeter non-bladed Optiview trocar and port were passed directly to the abdomen. The laparoscope was then used to confirm intraperitoneal placement. Findings included large fibroid uterus as noted above.  Locations for RLQ, LLQ, and suprapubic ports were noted by transillumination of the abdominal wall.  0.25% marcaine was used to anesthetize  the skin.  76m skin incision was made in the RLQ and an AirSeal port was placed underdirect visualization of the laparoscope.  Then a 579mskin incision was made and a 24m624monbladed trochar and port was placed in the LLQ.  Finally, and 8mm38min incision was made about 4cm above the pubic symphasis and an 8mm 30mn-bladed port was placed with direct visualization of the laparoscope.  All trochars were removed.    Ureters were identified.  Attention was turned to the right side which was more visible than the left. With uterus on stretch the right tube was excised off the ovary and mesosalpinx was dissected to free the tube. Then the right utero-ovarian pedicle was serially clamped cauterized and incised using the ligasure device. Right round ligament was serially clamped cauterized and incised. The anterior and posterior peritoneum of the inferior leaf of the broad ligament were opened. The beginning of the bladder flap was created.  The bladder was taken down below the level of the KOH ring. The right uterine artery skeletonized and then just superior to the KOH ring this vessel was serially clamped but not cut.    Attention was turned the left side.  The bulky uterus was placed on stretch to the opposite side.  The tube was excised off the ovary using sharp dissection a bipolar cautery.  The mesosalpinx was incised freeing the tube. Then the left uterine ovarian pedicle was serially clamped cauterized and incised. Next the left round ligament was serially clamped cauterized and incised. The anterior posterior peritoneum of the inferiorly for the broad ligament were opened. The anterior peritoneum was carried across to the dissection on the left side. The remainder of the bladder flap was created using sharp dissection. The bladder was well below the level of the KOH ring. The left uterine artery skeletonized. Then the left uterine artery, above the level of the KOH ring, was serially clamped cauterized and incised.   Attention was turned back to the right side and the right uterine artery was cauterized again and then incised.  The uterus was devascularized at this point.  The colpotomy was performed a starting in the midline and using a harmonic scalpel with the inferior edge of the open blade  This was carried  around a circumferential fashion until the vaginal mucosa was completely incised in the specimen was freed.  The specimen was then delivered to the vagina.  A vaginal occlusive device was used to maintain the pneumoperitoneum  Instruments were changed with a needle driver and Kobra graspers.  Using a 9 inch V. lock suture, the cuff was closed by incorporating the anterior and posterior vaginal mucosa in each stitch. This was carried across all the way to the left corner and a running fashion. Two stitches were brought back towards the midline and the suture was cut flush with the vagina. The needle was brought out the pelvis. The pelvis was irrigated. All pedicles were inspected. No bleeding was noted.   Co2 pressures were lowered to 8mm H72m Again, no bleeding was noted.  Ureters were noted deep in the pelvis to be peristalsing.  Arista was placed along the pedicles.  At this point the procedure was completed.  The remaining instruments were removed.  The ports (except the suprapubic port) were removed under direct visualization of the laparoscope and the pneumoperitoneum was relieved.  The patient was taken out of Trendelenburg positioning.  Several deep breaths were given to the patient's trying to any  gas the abdomen and finally the suprapubic port was removed.  The skin was then closed with subcuticular stitches of 3-0 Vicryl. The skin was cleansed Dermabond was applied. Attention was then turned the vagina and the cuff was inspected. No bleeding was noted. The anterior posterior vaginal mucosa was incorporated in each stitch. The Foley catheter was removed.  Cystoscopy was performed.  No sutures or bladder injuries were noted.  Ureters were noted with normal urine jets from each one was seen.  Foley was left out after the cystoscopic fluid was drained and cystoscope removed.  Sponge, lap, needle, instrument counts were correct x2. Patient tolerated the procedure very well. She was awakened from anesthesia,  extubated and taken to recovery in stable condition.   Uterus specimen was 692 grams weighed at end of case.  COUNTS:  YES  PLAN OF CARE: Transfer to PACU

## 2022-02-10 ENCOUNTER — Encounter (HOSPITAL_BASED_OUTPATIENT_CLINIC_OR_DEPARTMENT_OTHER): Payer: Self-pay | Admitting: Obstetrics & Gynecology

## 2022-02-10 LAB — SURGICAL PATHOLOGY

## 2022-02-17 ENCOUNTER — Encounter (HOSPITAL_BASED_OUTPATIENT_CLINIC_OR_DEPARTMENT_OTHER): Payer: Self-pay | Admitting: Obstetrics & Gynecology

## 2022-02-17 ENCOUNTER — Ambulatory Visit (INDEPENDENT_AMBULATORY_CARE_PROVIDER_SITE_OTHER): Payer: 59 | Admitting: Obstetrics & Gynecology

## 2022-02-17 VITALS — BP 117/82 | HR 71 | Ht 62.0 in | Wt 144.6 lb

## 2022-02-17 DIAGNOSIS — Z9889 Other specified postprocedural states: Secondary | ICD-10-CM

## 2022-02-17 NOTE — Progress Notes (Signed)
GYNECOLOGY  VISIT  CC:   post op recheck  HPI: 41 y.o. G54P0101 Married White or Caucasian female here for recheck after undergoing TLH/bilateral salpingectomy on 02/09/2022.  She reports bleeding is none.  She has some pain before bowel movements.  Isn't taking anything for pain.  Hasn't taken anything since last Wednesday.  Bowel function is Normal.  Bladder function is normal.    Pathology reviewed:  Yes .  Questions answered.    MEDS:   Current Outpatient Medications on File Prior to Visit  Medication Sig Dispense Refill   EPINEPHrine 0.3 mg/0.3 mL IJ SOAJ injection Inject 0.3 mg into the muscle as needed. 2 each 1   ibuprofen (ADVIL) 800 MG tablet Take 1 tablet (800 mg total) by mouth every 8 (eight) hours as needed. 30 tablet 0   Multiple Vitamin (MULTIVITAMIN WITH MINERALS) TABS tablet Take 1 tablet by mouth daily.     TURMERIC CURCUMIN PO Take by mouth.     No current facility-administered medications on file prior to visit.    SH:  Smoking No    PHYSICAL EXAMINATION:    BP 117/82 (BP Location: Left Arm, Patient Position: Sitting, Cuff Size: Normal)   Pulse 71   Ht '5\' 2"'$  (1.575 m) Comment: Reported  Wt 144 lb 9.6 oz (65.6 kg)   LMP 01/08/2022 (Approximate)   BMI 26.45 kg/m     General appearance: alert, cooperative and appears stated age CV:  Regular rate and rhythm Abdomen: soft, non-tender; bowel sounds normal; no masses,  no organomegaly Incisions:  C/D/I   Assessment/Plan: 1. Post-operative state - follow up 4 weeks.  Restrictions discussed.  Pelvic rest recommended.

## 2022-02-20 ENCOUNTER — Telehealth (HOSPITAL_BASED_OUTPATIENT_CLINIC_OR_DEPARTMENT_OTHER): Payer: Self-pay | Admitting: *Deleted

## 2022-02-20 NOTE — Telephone Encounter (Signed)
Pt called the office with reports of some spotting that occurred Tuesday night. She has noticed pink/red spotting or drops in the toilet when she uses the bathroom. Advised that if the bleeding increases, she should call provider on call this weekend and she should give [place] an update on Monday with the status of her bleeding. Pt verbalized understanding.

## 2022-03-03 DIAGNOSIS — N920 Excessive and frequent menstruation with regular cycle: Secondary | ICD-10-CM

## 2022-03-06 ENCOUNTER — Encounter: Payer: Self-pay | Admitting: Obstetrics & Gynecology

## 2022-03-10 ENCOUNTER — Encounter (HOSPITAL_BASED_OUTPATIENT_CLINIC_OR_DEPARTMENT_OTHER): Payer: Self-pay | Admitting: Obstetrics & Gynecology

## 2022-03-10 ENCOUNTER — Ambulatory Visit (INDEPENDENT_AMBULATORY_CARE_PROVIDER_SITE_OTHER): Payer: 59 | Admitting: Obstetrics & Gynecology

## 2022-03-10 VITALS — BP 121/84 | HR 73 | Ht 62.0 in | Wt 148.8 lb

## 2022-03-10 DIAGNOSIS — Z9889 Other specified postprocedural states: Secondary | ICD-10-CM

## 2022-03-10 NOTE — Progress Notes (Signed)
GYNECOLOGY  VISIT  CC:   post op recheck  HPI: 41 y.o. G3P0101 Married White or Caucasian female here for recheck after undergoing TLH/bilateral salpingectomy/cystoscopy on 02/17/2022.  Did have some spotting for about a week.  She is now having no bleeding.  She has minimal pain.  Only took narcotics once night.  Bowel function is  still somewhat slow and she is having some constipation .  Bladder function is normal.      MEDS:   Current Outpatient Medications on File Prior to Visit  Medication Sig Dispense Refill   EPINEPHrine 0.3 mg/0.3 mL IJ SOAJ injection Inject 0.3 mg into the muscle as needed. 2 each 1   Multiple Vitamin (MULTIVITAMIN WITH MINERALS) TABS tablet Take 1 tablet by mouth daily.     TURMERIC CURCUMIN PO Take by mouth. (Patient not taking: Reported on 03/10/2022)     No current facility-administered medications on file prior to visit.    SH:  Smoking No    PHYSICAL EXAMINATION:    BP 121/84   Pulse 73   Ht '5\' 2"'$  (1.575 m)   Wt 148 lb 12.8 oz (67.5 kg)   LMP 01/08/2022 (Approximate)   BMI 27.22 kg/m     General appearance: alert, cooperative and appears stated age CV:  Regular rate and rhythm Lungs:  clear to auscultation, no wheezes, rales or rhonchi, symmetric air entry Abdomen: soft, non-tender; bowel sounds normal; no masses,  no organomegaly Incisions:  C/D/I  Pelvic: External genitalia:  no lesions              Urethra:  normal appearing urethra with no masses, tenderness or lesions              Bartholins and Skenes: normal                 Vagina: normal appearing vagina with normal color and discharge, no lesions              Cervix: absent              Bimanual Exam:  Uterus:  uterus absent              Adnexa: no mass, fullness, tenderness  Chaperone, Octaviano Batty, was present for exam.  Assessment/Plan: 1. Post-operative state - doing well.  Will plan to recheck in about 6-8 weeks

## 2022-05-01 ENCOUNTER — Encounter (HOSPITAL_BASED_OUTPATIENT_CLINIC_OR_DEPARTMENT_OTHER): Payer: Self-pay | Admitting: Obstetrics & Gynecology

## 2022-05-01 ENCOUNTER — Ambulatory Visit (INDEPENDENT_AMBULATORY_CARE_PROVIDER_SITE_OTHER): Payer: 59 | Admitting: Obstetrics & Gynecology

## 2022-05-01 VITALS — BP 121/86 | HR 63 | Ht 62.0 in | Wt 152.4 lb

## 2022-05-01 DIAGNOSIS — Z9889 Other specified postprocedural states: Secondary | ICD-10-CM

## 2022-05-01 DIAGNOSIS — Z1231 Encounter for screening mammogram for malignant neoplasm of breast: Secondary | ICD-10-CM

## 2022-05-01 NOTE — Progress Notes (Unsigned)
GYNECOLOGY  VISIT  CC:   post op recheck  HPI: 41 y.o. G5P0101 Married White or Caucasian female here for post op visit.  Had some spotting at her last visit.     Past Medical History:  Diagnosis Date   Chest pain    hx of chest pain in 2012, per MD note it was thought to be musculoskeletal, 09/24/10 normal Cardiovascular stress MRI, 10/29/11 Echocardiogram EF 55 -60%   Family history of premature CAD    strong family hx of premature CAD, father had heart attack at age 70 and mother at age 35, [place] w/ cardiologist Dr. Buford Dresser on 08/22/21 in [place].   GERD (gastroesophageal reflux disease)    History of kidney stones    about 20 years ago per pt as of 01/20/22   Hyperlipemia    Iron deficiency anemia due to chronic blood loss 2023   Follows with Dr. Hale Bogus, OB-GYN. Hysterectomy is scheduled for 02/09/22.   Pulmonary nodule, right    stable, no monitoring needed per pt    MEDS:   Current Outpatient Medications on File Prior to Visit  Medication Sig Dispense Refill   EPINEPHrine 0.3 mg/0.3 mL IJ SOAJ injection Inject 0.3 mg into the muscle as needed. 2 each 1   Multiple Vitamin (MULTIVITAMIN WITH MINERALS) TABS tablet Take 1 tablet by mouth daily.     TURMERIC CURCUMIN PO Take by mouth.     No current facility-administered medications on file prior to visit.    ALLERGIES: Fluconazole in dextrose and Latex  SH:  married, non smoker  ROS  PHYSICAL EXAMINATION:    BP (!) 133/96 (BP Location: Right Arm, Patient Position: Sitting, Cuff Size: Normal)   Pulse 74   Ht '5\' 2"'$  (1.575 m) Comment: Reported  Wt 152 lb 6.4 oz (69.1 kg)   LMP 01/08/2022 (Approximate)   BMI 27.87 kg/m     General appearance: alert, cooperative and appears stated age Neck: no adenopathy, supple, symmetrical, trachea midline and thyroid {CHL AMB PHY EX THYROID NORM DEFAULT:[phone]::"normal to inspection and palpation"} CV:  {Exam; heart brief:31539} Lungs:  {pe lungs ob:314451} Breasts:  {Exam; breast:13139::"normal appearance, no masses or tenderness"} Abdomen: soft, non-tender; bowel sounds normal; no masses,  no organomegaly Lymph:  no inguinal LAD noted  Pelvic: External genitalia:  no lesions              Urethra:  normal appearing urethra with no masses, tenderness or lesions              Bartholins and Skenes: normal                 Vagina: normal appearing vagina with normal color and discharge, no lesions              Cervix: {CHL AMB PHY EX CERVIX NORM DEFAULT:[phone]::"no lesions"}              Bimanual Exam:  Uterus:  {CHL AMB PHY EX UTERUS NORM [phone]::"normal size, contour, position, consistency, mobility, non-tender"}              Adnexa: {CHL AMB PHY EX ADNEXA NO MASS DEFAULT:[phone]::"no mass, fullness, tenderness"}              Rectovaginal: {yes no:[id number]}.  Confirms.              Anus:  normal sphincter tone, no lesions  Chaperone, ***, CMA, was present for exam.  Assessment/Plan: There are no diagnoses linked to this  encounter.

## 2022-05-07 ENCOUNTER — Encounter (HOSPITAL_BASED_OUTPATIENT_CLINIC_OR_DEPARTMENT_OTHER): Payer: Self-pay | Admitting: Obstetrics & Gynecology

## 2022-05-07 ENCOUNTER — Ambulatory Visit (INDEPENDENT_AMBULATORY_CARE_PROVIDER_SITE_OTHER): Payer: 59 | Admitting: Obstetrics & Gynecology

## 2022-05-07 VITALS — BP 114/87 | HR 79 | Ht 62.0 in | Wt 152.2 lb

## 2022-05-07 DIAGNOSIS — L24A9 Irritant contact dermatitis due friction or contact with other specified body fluids: Secondary | ICD-10-CM

## 2022-05-07 DIAGNOSIS — Z9889 Other specified postprocedural states: Secondary | ICD-10-CM

## 2022-05-07 MED ORDER — SULFAMETHOXAZOLE-TRIMETHOPRIM 800-160 MG PO TABS: 1.0000 | ORAL_TABLET | Freq: Two times a day (BID) | ORAL | 0 refills | Status: DC

## 2022-05-07 NOTE — Progress Notes (Signed)
GYNECOLOGY  VISIT  CC:   post op recheck  HPI: 42 y.o. G9P0101 Married White or Caucasian female here for complaint of some drainage from incision in umbilicus.  Reports she's had some discomfort and then yesterday there was drainage and some blood.  It is feeling better today and there has been some mild more clear looking drainage.  No fever.  Not really tender.  Overall feeling good.  Has been for runs this week and these have felt good.   MEDS:   Current Outpatient Medications on File Prior to Visit  Medication Sig Dispense Refill   EPINEPHrine 0.3 mg/0.3 mL IJ SOAJ injection Inject 0.3 mg into the muscle as needed. 2 each 1   Multiple Vitamin (MULTIVITAMIN WITH MINERALS) TABS tablet Take 1 tablet by mouth daily.     TURMERIC CURCUMIN PO Take by mouth.     No current facility-administered medications on file prior to visit.    SH:  Smoking No    PHYSICAL EXAMINATION:    BP 114/87 (BP Location: Right Arm, Patient Position: Sitting, Cuff Size: Normal)   Pulse 79   Ht '5\' 2"'$  (1.575 m) Comment: Reported  Wt 152 lb 3.2 oz (69 kg)   LMP 01/08/2022 (Approximate)   BMI 27.84 kg/m     General appearance: alert, cooperative and appears stated age Umbilicus: mild erythema, minimal drainage.  Incision does not appear open.  Probed, mildly tender   Assessment/Plan: 1. Post-operative state  2. Drainage from wound - will treat with short course of bactrim.  Advised pt to please update if drainage continues through the weekend.   - sulfamethoxazole-trimethoprim (BACTRIM DS) 800-160 MG tablet; Take 1 tablet by mouth 2 (two) times daily.  Dispense: 10 tablet; Refill: 0

## 2022-05-13 ENCOUNTER — Telehealth (HOSPITAL_BASED_OUTPATIENT_CLINIC_OR_DEPARTMENT_OTHER): Payer: Self-pay

## 2022-05-13 NOTE — Telephone Encounter (Signed)
Patient called today to let us know that she is still having drainage from the umbilical site. She states that she was given bactrim to take. She states that she was asked at her appointment if she was allergic to bactrim, but was unsure if she was or not. Now that she has had time to think about it, her mother is allergic to bactrim. She is not sure that she is comfortable with taking the bactrim and wants to know if there is something else that can be called in for her. Please advise. tbw

## 2022-05-15 NOTE — Telephone Encounter (Signed)
Called and spoke with patient 05/14/2022 about the below message. Patient expresses understanding and will pick up medication. Patient was advised to call office if she has any questions or concerns to arise. tbw

## 2022-06-18 ENCOUNTER — Encounter (HOSPITAL_BASED_OUTPATIENT_CLINIC_OR_DEPARTMENT_OTHER): Payer: 59 | Admitting: Nurse Practitioner

## 2022-06-29 ENCOUNTER — Inpatient Hospital Stay (HOSPITAL_BASED_OUTPATIENT_CLINIC_OR_DEPARTMENT_OTHER): Admission: RE | Admit: 2022-06-29 | Payer: 59 | Source: Ambulatory Visit | Admitting: Radiology

## 2022-07-28 ENCOUNTER — Telehealth (HOSPITAL_BASED_OUTPATIENT_CLINIC_OR_DEPARTMENT_OTHER): Payer: Self-pay | Admitting: *Deleted

## 2022-07-28 NOTE — Telephone Encounter (Signed)
Tc from pt - verified DOB - umbilical incision started draining again last week.  Clear - yellow with red twinge of color to it.  Pt has started working out.  States belly button is deep therefore cannot see the actual incision.  Last time seen in Jan same type symptoms and was given ABX and pt's symptoms went away.  Advised best for OV.  Pt scheduled KW CMA

## 2022-07-29 ENCOUNTER — Encounter (HOSPITAL_BASED_OUTPATIENT_CLINIC_OR_DEPARTMENT_OTHER): Payer: Self-pay | Admitting: Obstetrics & Gynecology

## 2022-07-29 ENCOUNTER — Ambulatory Visit (INDEPENDENT_AMBULATORY_CARE_PROVIDER_SITE_OTHER): Payer: Self-pay | Admitting: Obstetrics & Gynecology

## 2022-07-29 VITALS — BP 131/66 | HR 67

## 2022-07-29 DIAGNOSIS — B372 Candidiasis of skin and nail: Secondary | ICD-10-CM

## 2022-07-29 MED ORDER — NYSTATIN 100000 UNIT/GM EX CREA: 1.0000 | TOPICAL_CREAM | Freq: Two times a day (BID) | CUTANEOUS | 0 refills | Status: AC

## 2022-07-29 NOTE — Progress Notes (Signed)
GYNECOLOGY  VISIT  CC:   umbilical incision  HPI: 42 y.o. G57P0101 Married White or Caucasian female here for concerns about drainage from her umbilical incision.  She has noticed a possible rash with red spots.  Concerned about healing.  Had TLH/bilateral salpingectomy/cystoscopy 10/9.  Otherwise doing well.  No pain.   Past Medical History:  Diagnosis Date   Chest pain    hx of chest pain in 2012, per MD note it was thought to be musculoskeletal, 09/24/10 normal Cardiovascular stress MRI, 10/29/11 Echocardiogram EF 55 -60%   Family history of premature CAD    strong family hx of premature CAD, father had heart attack at age 26 and mother at age 68, [place] w/ cardiologist Dr. Buford Dresser on 08/22/21 in [place].   GERD (gastroesophageal reflux disease)    History of kidney stones    about 20 years ago per pt as of 01/20/22   Hyperlipemia    Iron deficiency anemia due to chronic blood loss 2023   Follows with Dr. Hale Bogus, OB-GYN. Hysterectomy is scheduled for 02/09/22.   Pulmonary nodule, right    stable, no monitoring needed per pt    MEDS:   Current Outpatient Medications on File Prior to Visit  Medication Sig Dispense Refill   EPINEPHrine 0.3 mg/0.3 mL IJ SOAJ injection Inject 0.3 mg into the muscle as needed. 2 each 1   Multiple Vitamin (MULTIVITAMIN WITH MINERALS) TABS tablet Take 1 tablet by mouth daily.     TURMERIC CURCUMIN PO Take by mouth.     No current facility-administered medications on file prior to visit.    ALLERGIES: Fluconazole in dextrose and Latex  SH:  married, non smoker  Review of Systems  Constitutional: Negative.     PHYSICAL EXAMINATION:    BP 131/66 (BP Location: Right Arm, Patient Position: Sitting, Cuff Size: Large)   Pulse 67   LMP 01/08/2022 (Approximate)     General appearance: alert, cooperative and appears stated age  Abdomen: soft, non-tender; bowel sounds normal; no masses,  no organomegaly, umbilicus with erythema and some  satellite lesions c/w yeast, incision inside umbilicus is fully healed  Assessment/Plan: 1. Candidal skin infection - nystatin cream (MYCOSTATIN); Apply 1 Application topically 2 (two) times daily. Apply to affected area BID for up to 7 days.  Dispense: 30 g; Refill: 0  - after this is cleared up, recommended pt clean umbilicus with q tip soaked in alcohol one to two times weekly to help prevent recurrence

## 2022-08-25 ENCOUNTER — Encounter (HOSPITAL_BASED_OUTPATIENT_CLINIC_OR_DEPARTMENT_OTHER): Payer: Self-pay | Admitting: Obstetrics & Gynecology

## 2022-08-25 ENCOUNTER — Ambulatory Visit (INDEPENDENT_AMBULATORY_CARE_PROVIDER_SITE_OTHER): Payer: Self-pay | Admitting: Obstetrics & Gynecology

## 2022-08-25 VITALS — BP 123/91 | HR 74 | Ht 62.0 in | Wt 154.2 lb

## 2022-08-25 DIAGNOSIS — L03316 Cellulitis of umbilicus: Secondary | ICD-10-CM

## 2022-08-25 MED ORDER — SULFAMETHOXAZOLE-TRIMETHOPRIM 800-160 MG PO TABS: 1.0000 | ORAL_TABLET | Freq: Two times a day (BID) | ORAL | 0 refills | Status: AC

## 2022-08-28 LAB — WOUND CULTURE: Organism ID, Bacteria: NONE SEEN

## 2022-09-01 NOTE — Progress Notes (Signed)
GYNECOLOGY  VISIT  CC:   drainage at umbilicus  HPI: 42 y.o. G14P0101 Married White or Caucasian female here for some bloody drainage that started from her umbilicus.  Has been having some intermittent drainage from area.  Prior visit, I was concerned with moisture and yeast and she has been using an anti-fungal agent.  Then yesterday, drainage began to increased.  Today, there is a little blood tinged fluid present.  There is some tenderness as well.  Denies fever or feeling ill.  Underwent TLH 02/09/2022.   Past Medical History:  Diagnosis Date   Chest pain    hx of chest pain in 2012, per MD note it was thought to be musculoskeletal, 09/24/10 normal Cardiovascular stress MRI, 10/29/11 Echocardiogram EF 55 -60%   Family history of premature CAD    strong family hx of premature CAD, father had heart attack at age 63 and mother at age 67, [place] w/ cardiologist Dr. Jodelle Red on 08/22/21 in [facility].   GERD (gastroesophageal reflux disease)    History of kidney stones    about 20 years ago per pt as of 01/20/22   Hyperlipemia    Iron deficiency anemia due to chronic blood loss 2023   Follows with Dr. Valentina Shaggy, OB-GYN. Hysterectomy is scheduled for 02/09/22.   Pulmonary nodule, right    stable, no monitoring needed per pt    MEDS:   Current Outpatient Medications on File Prior to Visit  Medication Sig Dispense Refill   EPINEPHrine 0.3 mg/0.3 mL IJ SOAJ injection Inject 0.3 mg into the muscle as needed. 2 each 1   Multiple Vitamin (MULTIVITAMIN WITH MINERALS) TABS tablet Take 1 tablet by mouth daily.     nystatin cream (MYCOSTATIN) Apply 1 Application topically 2 (two) times daily. Apply to affected area BID for up to 7 days. 30 g 0   TURMERIC CURCUMIN PO Take by mouth.     No current facility-administered medications on file prior to visit.    ALLERGIES: Fluconazole in dextrose and Latex  SH:  married, non smoker  Review of Systems  Constitutional: Negative.   Genitourinary:  Negative.     PHYSICAL EXAMINATION:    BP (!) 123/91 (BP Location: Left Arm, Patient Position: Sitting, Cuff Size: Large)   Pulse 74   Ht 5\' 2"  (1.575 m) Comment: Reported  Wt 154 lb 3.2 oz (69.9 kg)   LMP 01/08/2022 (Approximate)   BMI 28.20 kg/m     General appearance: alert, cooperative and appears stated age Abdomen: soft, non-tender; no masses,  no organomegaly.  No visible opening within umbilicus.  Visualized no opening in umbilicus.  Area of tenderness to upper right in umbilicus   Assessment/Plan: 1. Cellulitis of umbilicus - WOUND CULTURE - sulfamethoxazole-trimethoprim (BACTRIM DS) 800-160 MG tablet; Take 1 tablet by mouth 2 (two) times daily.  Dispense: 14 tablet; Refill: 0 - if culture is negative and symptoms continue, will need to check CT abd to see if there is a fluid collection

## 2022-09-04 ENCOUNTER — Other Ambulatory Visit (HOSPITAL_BASED_OUTPATIENT_CLINIC_OR_DEPARTMENT_OTHER): Payer: Self-pay | Admitting: Obstetrics & Gynecology

## 2022-09-04 DIAGNOSIS — R1909 Other intra-abdominal and pelvic swelling, mass and lump: Secondary | ICD-10-CM
